# Patient Record
Sex: Male | Born: 1975 | Race: White | Hispanic: No | Marital: Married | Smoking: Former smoker
Health system: Southern US, Community
[De-identification: ages and names within clinical notes are randomized; demographics above are authoritative.]

## PROBLEM LIST (undated history)

## (undated) DIAGNOSIS — T7840XA Allergy, unspecified, initial encounter: Secondary | ICD-10-CM

## (undated) DIAGNOSIS — F419 Anxiety disorder, unspecified: Secondary | ICD-10-CM

## (undated) DIAGNOSIS — J45909 Unspecified asthma, uncomplicated: Secondary | ICD-10-CM

## (undated) DIAGNOSIS — U071 COVID-19: Secondary | ICD-10-CM

## (undated) DIAGNOSIS — R011 Cardiac murmur, unspecified: Secondary | ICD-10-CM

## (undated) DIAGNOSIS — K219 Gastro-esophageal reflux disease without esophagitis: Secondary | ICD-10-CM

## (undated) DIAGNOSIS — E785 Hyperlipidemia, unspecified: Secondary | ICD-10-CM

## (undated) HISTORY — DX: Unspecified asthma, uncomplicated: J45.909

## (undated) HISTORY — DX: Gastro-esophageal reflux disease without esophagitis: K21.9

## (undated) HISTORY — DX: Anxiety disorder, unspecified: F41.9

## (undated) HISTORY — DX: Hyperlipidemia, unspecified: E78.5

## (undated) HISTORY — DX: Cardiac murmur, unspecified: R01.1

## (undated) HISTORY — DX: Allergy, unspecified, initial encounter: T78.40XA

---
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
## 2002-04-04 HISTORY — PX: ANKLE SURGERY: SHX546

## 2005-09-08 ENCOUNTER — Emergency Department: Payer: Self-pay | Admitting: General Practice

## 2007-06-03 HISTORY — PX: ESOPHAGOGASTRODUODENOSCOPY: SHX1529

## 2007-09-20 ENCOUNTER — Emergency Department: Payer: Self-pay | Admitting: Emergency Medicine

## 2007-11-02 ENCOUNTER — Ambulatory Visit: Payer: Self-pay | Admitting: Internal Medicine

## 2007-11-02 DIAGNOSIS — K219 Gastro-esophageal reflux disease without esophagitis: Secondary | ICD-10-CM | POA: Insufficient documentation

## 2007-11-02 DIAGNOSIS — F419 Anxiety disorder, unspecified: Secondary | ICD-10-CM

## 2007-11-02 DIAGNOSIS — J309 Allergic rhinitis, unspecified: Secondary | ICD-10-CM | POA: Insufficient documentation

## 2007-11-05 LAB — CONVERTED CEMR LAB
Cholesterol: 178 mg/dL (ref 0–200)
LDL Cholesterol: 108 mg/dL — ABNORMAL HIGH (ref 0–99)
Triglycerides: 164 mg/dL — ABNORMAL HIGH (ref 0–149)
VLDL: 33 mg/dL (ref 0–40)

## 2008-05-30 ENCOUNTER — Ambulatory Visit: Payer: Self-pay | Admitting: Internal Medicine

## 2008-07-16 ENCOUNTER — Ambulatory Visit: Payer: Self-pay | Admitting: Family Medicine

## 2009-05-14 ENCOUNTER — Ambulatory Visit: Payer: Self-pay | Admitting: Family Medicine

## 2009-05-14 LAB — CONVERTED CEMR LAB
Blood in Urine, dipstick: NEGATIVE
Urobilinogen, UA: 0.2
WBC Urine, dipstick: NEGATIVE

## 2009-06-26 ENCOUNTER — Telehealth (INDEPENDENT_AMBULATORY_CARE_PROVIDER_SITE_OTHER): Payer: Self-pay | Admitting: *Deleted

## 2009-09-08 ENCOUNTER — Telehealth: Payer: Self-pay | Admitting: Internal Medicine

## 2009-10-09 ENCOUNTER — Ambulatory Visit: Payer: Self-pay | Admitting: Internal Medicine

## 2010-02-19 ENCOUNTER — Ambulatory Visit: Payer: Self-pay | Admitting: Internal Medicine

## 2010-03-22 ENCOUNTER — Ambulatory Visit: Payer: Self-pay | Admitting: Internal Medicine

## 2010-05-04 NOTE — Assessment & Plan Note (Signed)
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 * Cipro  Past History:  Past medical, surgical, family and social histories (including risk factors) reviewed for relevance to current acute and chronic problems.  Past Medical History: Reviewed history from 11/02/2007 and no changes required. GERD Anxiety Allergic rhinitis  Past Surgical History: Reviewed history from 11/02/2007 and no changes required. 3/09 EGD--hiatal hernia and GERD 1995 Left 2nd finger repair 2004 Left ankle surgery  Family History: Dad with DM Mom --good health but chronic depression 1 brother HTN  & CAD in pat grandparents Mat GF died of lung cancer Mat GM died of Lewy body dementia No colon or prostate cancer  Social History: Reviewed history from 05/14/2009 and no changes required. 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   Psych:  normally interactive, good eye contact, not anxious appearing, and not depressed appearing.     Impression & Recommendations:  Problem # 1:  ANXIETY (ICD-300.00) Assessment Comment Only did have spell last month when on the road---dong better now discussed non pharmacologic anxiety relieving tasks, like exercise will continue to use as needed vailum buspar again if more persistent symptoms  The following medications were removed from the medication list:    Diazepam 5 Mg Tabs (Diazepam) .Marland Kitchen... 1/2 - 1 three times a day as needed for nerves  Patient Instructions: 1)  Please schedule a follow-up appointment in 4-6 months for physical. Cancel the appt later this month  Current Allergies (reviewed today): ! * CIPRO

## 2010-05-04 NOTE — Progress Notes (Signed)
Summary: needs something for anxiety  Phone Note Call from Patient Call back at Work Phone [phone]   Caller: Patient Call For: Cindee Salt MD Summary of Call: Pt is out of town on business and he is having bad anxiety attacks for the last couple of days.  He says he used to take buspar and diazepam for this.  He is asking if he can have something called in until he can get back to town and come in for a visit.  Please send to [facility] [place], [place]-  [phone]. Initial call taken by: Lowella Petties CMA,  September 08, 2009 1:03 PM  Follow-up for Phone Call        okay to send Rx for diazepam 5mg  1/2 - 1 three times a day as needed for nerves #20 x 0  set up appt for when he is back in town Follow-up by: Cindee Salt MD,  September 08, 2009 1:58 PM  Additional Follow-up for Phone Call Additional follow up Details #1::        Rx Called In, Spoke with patient and advised results.  Additional Follow-up by: Mervin Hack CMA Duncan Dull),  September 08, 2009 4:11 PM    New/Updated Medications: DIAZEPAM 5 MG TABS (DIAZEPAM) 1/2 - 1 three times a day as needed for nerves Prescriptions: DIAZEPAM 5 MG TABS (DIAZEPAM) 1/2 - 1 three times a day as needed for nerves  #20 x 0   Entered by:   Mervin Hack CMA (AAMA)   Authorized by:   Cindee Salt MD   Signed by:   Mervin Hack CMA (AAMA) on 09/08/2009   Method used:   Telephoned to ...         RxID:   [id number]

## 2010-05-04 NOTE — Assessment & Plan Note (Signed)
Summary: SINUS INFECTION/DLO   Vital Signs:  Patient profile:   35 year old male Weight:      196.25 pounds Temp:     98.5 degrees F oral Pulse rate:   76 / minute Pulse rhythm:   regular BP sitting:   108 / 70  (right arm) Cuff size:   regular  Vitals Entered By: Selena Batten Dance CMA Duncan Dull) (February 19, 2010 11:21 AM) CC: ? Sinus infection   History of Present Illness: CC: ? sinus infection  1wk h/o congestion, spitting up green mucous.  started with fever whice has since resolved.  + drainage at night, cough worse at night.  taking allegra D, tylenol cold.  Now started mucinex which is not really helping get mucous out.  + ears muffled up.  + purulent discharge out of cough and nose.    No RN, HA, abd pain, n/v/d, rashes, myalgias or arthalgias.  No more fevers.  increased work recently.  ? sinus infection.  + son sick (15mo).    No smokers at home.  no recent asthma.    Current Medications (verified): 1)  None  Allergies: 1)  ! 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   Head:  Normocephalic and atraumatic without obvious abnormalities. No apparent alopecia or balding. NT sinuses Eyes:  PERRLA, EOMI, no injection Ears:  TMs clear Nose:  nares clear Mouth:  MMM, no pharyngeal erythema/edema Neck:  No deformities, masses, or tenderness noted.  no LAD Lungs:  Normal respiratory effort, chest expands symmetrically. Lungs are clear to auscultation, no crackles or wheezes. Heart:  Normal rate and regular rhythm. S1 and S2 normal without gallop, murmur, click, rub or other extra sounds. Pulses:  2+ rad pulses, brisk cap  refill Extremities:  no edema Skin:  no rashes and no suspicious lesions.     Impression & Recommendations:  Problem # 1:  VIRAL URI (ICD-465.9) could be early sinus infection.  supportive care for now.  1 wk hx.  advised to call and let us know if not better Monday. red flags to return dsicussed His updated medication list for this problem includes:    Cheratussin Ac 100-10 Mg/66ml Syrp (Guaifenesin-codeine) ..... One teaspoon at bedtime as needed cough  Complete Medication List: 1)  Flonase 50 Mcg/act Susp (Fluticasone propionate) .... 2 squirts in each nostril daily 2)  Cheratussin Ac 100-10 Mg/67ml Syrp (Guaifenesin-codeine) .... One teaspoon at bedtime as needed cough  Patient Instructions: 1)  Sounds like you have a viral upper respiratory infection. 2)  Antibiotics are not needed for this.  Viral infections usually take 7-10 days to resolve.  The cough can last up to 4-6 weeks to go away. 3)  Nasal saline spray or neti pot to  4)  Mucinex one a day or guaifenesin immediate release in am and at noon with plenty of fluid. 5)  Use medication as prescribed: flonase and cheratussin at night for cough. 6)  Please return if you are not improving as expected, or if you have high fevers (>101.5) or difficulty breathing. 7)  Call clinic with questions.  Pleasure to see you today. Prescriptions: CHERATUSSIN AC 100-10 MG/5ML SYRP (GUAIFENESIN-CODEINE)  one teaspoon at bedtime as needed cough  #100cc x 0   Entered and Authorized by:   Eustaquio Boyden  MD   Signed by:   Eustaquio Boyden  MD on 02/19/2010   Method used:   Print then Give to Patient   RxID:   [id number] FLONASE 50 MCG/ACT SUSP (FLUTICASONE PROPIONATE) 2 squirts in each nostril daily  #1 x 3   Entered and Authorized by:   Eustaquio Boyden  MD   Signed by:   Eustaquio Boyden  MD on 02/19/2010   Method used:   [facility] [address] [id number]       [address]       [place], [place]   [address]       Ph: [phone] or [phone]       Fax: [phone]   RxID:   [id number]    Orders Added: 1)  Est. Patient Level III [875[address]]    Prior Medications: Current Allergies (reviewed today): ! * CIPRO

## 2010-05-04 NOTE — Progress Notes (Signed)
Summary: Saw Urologist in [place]  Phone Note Outgoing Call   Call placed by: Daine Gip,  June 26, 2009 11:29 AM Call placed to: Patient Details for Reason: Pt went to Urology Physician in [place], [place] Summary of Call: Called pt regarding the Urology referral scheduled in Feb, 2011.  Pt says he saw a Urology physician in [place]- Dr. Carylon Perches (pt was not sure of the spelling).  Koray will call this physican and have the notes sent over to our office.Daine Gip  June 26, 2009 11:32 AM Initial call taken by: Daine Gip,  June 26, 2009 11:32 AM  Follow-up for Phone Call        okay Cindee Salt MD  June 26, 2009 5:36 PM

## 2010-05-04 NOTE — Assessment & Plan Note (Signed)
Summary: pain when urinate/ alc   Vital Signs:  Patient profile:   35 year old male Height:      71 inches Weight:      199.13 pounds BMI:     27.87 Temp:     98.4 degrees F oral Pulse rate:   88 / minute Pulse rhythm:   regular BP sitting:   118 / 92  (left arm) Cuff size:   regular  Vitals Entered By: Delilah Shan CMA Duncan Dull) (May 14, 2009 9:47 AM) CC: Painful urination   History of Present Illness: 35 yo with 4 days of dysuria, perienal and low back pain. No fevers or chills. Has had increased frequency and feeling of not completely emptying his bladder. Noticed some constipation as well a couple days ago. Sexually active with wife only.  She had baby 7 weeks ago so they have only had sex once since baby was born.  These symptoms started shortly after sexual intercourse. Also started working out in gym, using stationary bike.  He is unsure if that is related to this discomfort. No n/v/d.   Current Medications (verified): 1)  Multivitamins   Tabs (Multiple Vitamin) .... Take 1 Tablet By Mouth Once A Day 2)  Vitamin D 1000 Unit  Tabs (Cholecalciferol) .... Take 1 Tablet By Mouth Once A Day 3)  Fish Oil   Oil (Fish Oil) .... Take 1 Tablet By Mouth Once A Day 4)  Bactrim Ds 800-160 Mg Tabs (Sulfamethoxazole-Trimethoprim) .Marland Kitchen.. 1 Tab By Mouth Two Times A Day X 10 Days  Allergies: 1)  ! 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 GI:  Complains of constipation; denies nausea and vomiting. GU:  Complains of dysuria, urinary frequency, and urinary hesitancy; denies discharge, erectile dysfunction, hematuria, and incontinence.  Physical Exam  General:  alert, well-developed, well-nourished, and well-hydrated.   VS reviewed- afebrile and normotensive Abdomen:  soft,  non-tender, and no masses.   Genitalia:  no scrotal masses and no testicular masses or atrophy.   Prostate:  boggy, non tender.   Skin:  no rashes and no suspicious lesions.   Psych:  normally interactive, good eye contact, not anxious appearing, and not depressed appearing.     Impression & Recommendations:  Problem # 1:  DYSURIA (ICD-788.1) Assessment New UA neg for blood, LE or nitrites.  Most like prostatits but will send urine for culture and refer to urology for further work up given age and level of anxiety concerning these symptoms. Treat with Bactrim (allergy for cipro). See patient instructions for further details. The following medications were removed from the medication list:    Cipro 500 Mg Tabs (Ciprofloxacin hcl) .Marland Kitchen... 1 tab by mouth two times a day x 14 days His updated medication list for this problem includes:    Bactrim Ds 800-160 Mg Tabs (Sulfamethoxazole-trimethoprim) .Marland Kitchen... 1 tab by mouth two times a day x 10 days  Orders: UA Dipstick w/o Micro (manual) (74259) T-Culture, Urine ([id number]) Urology Referral (Urology)  Complete Medication List: 1)  Multivitamins Tabs (Multiple vitamin) .... Take 1 tablet by mouth once a day 2)  Vitamin D 1000 Unit Tabs (Cholecalciferol) .... Take 1 tablet by mouth once a day 3)  Fish Oil Oil (Fish oil) .... Take 1 tablet by mouth once a day 4)  Bactrim Ds 800-160 Mg Tabs (Sulfamethoxazole-trimethoprim) .Marland Kitchen.. 1 tab by mouth two times a day x 10 days  Patient Instructions: 1)  It was so nice to meet you, Shaun Phillips. 2)  Take Bactrim as directed. 3)  Use stool softeners to help with constipation and Ibuprofen up to 800 mg three times a day for discomfort. 4)  Please stop by to see Shirlee Limerick on your way out to set up your urology referral. Prescriptions: BACTRIM DS 800-160 MG TABS (SULFAMETHOXAZOLE-TRIMETHOPRIM) 1 tab by mouth two times a day x 10 days  #20 x 0   Entered and Authorized by:   Ruthe Mannan MD   Signed by:   Ruthe Mannan MD on  05/14/2009   Method used:   [facility]        [facility] [address] [id number]       [address]       [place], [place]  [address]       Ph: [phone] or [phone]       Fax: [phone]   RxID:   [id number] CIPRO 500 MG TABS (CIPROFLOXACIN HCL) 1 tab by mouth two times a day x 14 days  #28 x 0   Entered and Authorized by:   Ruthe Mannan MD   Signed by:   Ruthe Mannan MD on 05/14/2009   Method used:   [facility]        [facility] [id number]       [address]       [place], [place]  [address]       Ph: [phone] or [phone]       Fax: [phone]   RxID:   [id number]   Current Allergies (reviewed today): ! * CIPRO  Laboratory Results   Urine Tests   Date/Time Reported: May 14, 2009 10:05 AM   Routine Urinalysis   Color: yellow Appearance: Clear Glucose: negative   (Normal Range: Negative) Bilirubin: negative   (Normal Range: Negative) Ketone: negative   (Normal Range: Negative) Spec. Gravity: 1.020   (Normal Range: 1.003-1.035) Blood: negative   (Normal Range: Negative) pH: 5.0   (Normal Range: 5.0-8.0) Protein: trace   (Normal Range: Negative) Urobilinogen: 0.2   (Normal Range: 0-1) Nitrite: negative   (Normal Range: Negative) Leukocyte Esterace: negative   (Normal Range: Negative)        Appended Document: pain when urinate/ alc

## 2010-05-06 NOTE — Assessment & Plan Note (Signed)
Summary: CPX.Shaun KitchenCYD   Vital Signs:  Patient profile:   35 year old male Height:      71 inches Weight:      197 pounds Temp:     98.4 degrees F oral Pulse rate:   80 / minute Pulse rhythm:   regular BP sitting:   120 / 90  (left arm) Cuff size:   large  Vitals Entered By: Mervin Hack CMA Duncan Dull) (March 22, 2010 11:38 AM) CC: adult physical   History of Present Illness: Still travels for work all the time Trying to get a job with less travel  Has only needed the valium once or twice since last visit for anxiety some months ago No ongoing problems  Trying to exercise more  Still has some yellow mucus in chest some cough flonase is helping the nose though No fever  Allergies: 1)  ! * Cipro  Past History:  Past medical, surgical, family and social histories (including risk factors) reviewed for relevance to current acute and chronic problems.  Past Medical History: Reviewed history from 11/02/2007 and no changes required. GERD Anxiety Allergic rhinitis  Past Surgical History: Reviewed history from 11/02/2007 and no changes required. 3/09 EGD--hiatal hernia and GERD 1995 Left 2nd finger repair 2004 Left ankle surgery  Family History: Reviewed history from 10/09/2009 and no changes required. Dad with DM Mom --good health but chronic depression 1 brother HTN  & CAD in pat grandparents Mat GF died of lung cancer Mat GM died of Lewy body dementia No colon or prostate cancer  Social History: Reviewed history from 05/14/2009 and no changes required. 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 Eyes:  Denies double vision and vision loss-1 eye. ENT:  Denies decreased hearing and ringing in ears; teeth fine--regular with dentist. CV:  Denies chest pain or discomfort,  difficulty breathing at night, difficulty breathing while lying down, fainting, lightheadness, palpitations, and shortness of breath with exertion. Resp:  Complains of cough; denies shortness of breath; cough with current illness. GI:  Complains of indigestion; denies abdominal pain, bloody stools, change in bowel habits, and dark tarry stools; gets intermittent reflux--zegerid in the past. GU:  Denies erectile dysfunction, incontinence, urinary frequency, and urinary hesitancy; did have one spell of prostatitis. MS:  Denies joint pain and joint swelling. Derm:  Denies lesion(s) and rash; sees derm regularly for mole check---mom had melanoma. Neuro:  Denies headaches, numbness, tingling, and weakness. Psych:  Denies anxiety and depression. Heme:  Denies abnormal bruising and enlarge lymph nodes. Allergy:  Complains of seasonal allergies and sneezing; just spring mostly uses allegra.  Physical Exam  General:  alert and normal appearance.   Eyes:  pupils equal, pupils round, pupils reactive to light, and no optic disk abnormalities.   Ears:  R ear normal and L ear normal.   Mouth:  no erythema, no exudates, and no lesions.   Neck:  supple, no masses, no thyromegaly, no carotid bruits, and no cervical lymphadenopathy.   Lungs:  normal respiratory effort, no intercostal retractions, no accessory muscle use, and normal breath sounds.   Heart:  normal rate, regular rhythm, no murmur, and no gallop.   Abdomen:  soft, non-tender, and no masses.   Msk:  no joint tenderness and no joint swelling.   Pulses:  normal in feet Extremities:  no edema Neurologic:  alert & oriented X3, strength normal in all extremities, and gait normal.   Skin:  no rashes and no suspicious lesions.   Multiple benign nevi Axillary Nodes:  No palpable lymphadenopathy Psych:  normally interactive, good eye contact, not anxious appearing, and not depressed appearing.     Impression & Recommendations:  Problem # 1:   PREVENTIVE HEALTH CARE (ICD-V70.0) Assessment Comment Only healthy discussed increased exercise and sleep hygiene  Problem # 2:  BRONCHITIS- ACUTE (ICD-466.0) Assessment: Deteriorated persistent resp infection with some purulent sputum will try amoxi  Problem # 3:  ANXIETY (ICD-300.00) Assessment: Improved rarely needs diazepam no other Rx indicated  Problem # 4:  GERD (ICD-530.81) Assessment: Deteriorated some symptoms will have him try as needed PPI His updated medication list for this problem includes:    Omeprazole 20 Mg Tbec (Omeprazole) .Shaun Phillips... 1 tab daily as needed for acid reflux  Complete Medication List: 1)  Flonase 50 Mcg/act Susp (Fluticasone propionate) .... 2 squirts in each nostril daily 2)  Omeprazole 20 Mg Tbec (Omeprazole) .Shaun Phillips.. 1 tab daily as needed for acid reflux 3)  Amoxicillin 500 Mg Tabs (Amoxicillin) .... 2 tabs by mouth two times a day for bronchitis  Patient Instructions: 1)  Please use the omeprazole as needed for acid problems 2)  Take the antibiotic and call next week if the productive cough hasn't improved 3)  Please schedule a follow-up appointment in 1-2 years Prescriptions: AMOXICILLIN 500 MG TABS (AMOXICILLIN) 2 tabs by mouth two times a day for bronchitis  #40 x 0   Entered and Authorized by:   Cindee Salt MD   Signed by:   Cindee Salt MD on 03/22/2010   Method used:   [facility]        [facility] [address] [facility]       [address]       [place], [place]  [address]       Ph: [phone] or [phone]       Fax: [phone]   RxID:   [id number]    Orders Added: 1)  Est. Patient 18-39 years [[address]]    Current Allergies (reviewed today): ! * CIPRO

## 2011-09-07 ENCOUNTER — Telehealth: Payer: Self-pay | Admitting: Internal Medicine

## 2011-09-07 NOTE — Telephone Encounter (Signed)
Caller: Jamey/Patient is calling with a question about Diazepam.The medication was written by Katherina Right is out of town working in [place].  States that he has seen MD in past d/t panic attacks.  Has taken Diazepam in the past.  Requesting Diazepam to be called in to [facility] [phone].  Has been having panic attacks while being out of town.  Today, was shopping in a store and started not feeling well.  Felt SOB at that time but symptoms have started to decrease.  Continuing to feel "not clear/dizzy".  Utilized Anxiety: Panic Guideline.  Advised ED.  Pt is undecided if he will go to ED.  Requesting medication to be called in.  Please f/u with Kellie Shropshire.

## 2011-09-08 NOTE — Telephone Encounter (Signed)
Spoke with patient he went to the ER and they gave him lorazepam so he doesn't need the diazepam, pt states he should be home in the next week or so, then he will set up CPX.

## 2011-09-08 NOTE — Telephone Encounter (Signed)
I know about his anxiety and diazepam use in the past Okay to send order for diazepam 5mg   1 bid prn for anxiety or panic #10 x 0 Needs to set up physical since he hasn't been seen in a couple of years

## 2012-04-25 ENCOUNTER — Ambulatory Visit (INDEPENDENT_AMBULATORY_CARE_PROVIDER_SITE_OTHER): Payer: 59 | Admitting: Internal Medicine

## 2012-04-25 ENCOUNTER — Encounter: Payer: Self-pay | Admitting: Internal Medicine

## 2012-04-25 VITALS — BP 128/88 | HR 75 | Temp 98.0°F | Ht 70.5 in | Wt 199.0 lb

## 2012-04-25 DIAGNOSIS — F411 Generalized anxiety disorder: Secondary | ICD-10-CM

## 2012-04-25 DIAGNOSIS — R0789 Other chest pain: Secondary | ICD-10-CM

## 2012-04-25 DIAGNOSIS — Z0000 Encounter for general adult medical examination without abnormal findings: Secondary | ICD-10-CM

## 2012-04-25 DIAGNOSIS — K219 Gastro-esophageal reflux disease without esophagitis: Secondary | ICD-10-CM

## 2012-04-25 LAB — BASIC METABOLIC PANEL
BUN: 17 mg/dL (ref 6–23)
CO2: 30 mEq/L (ref 19–32)
Calcium: 9.9 mg/dL (ref 8.4–10.5)
Glucose, Bld: 92 mg/dL (ref 70–99)
Potassium: 4 mEq/L (ref 3.5–5.1)
Sodium: 138 mEq/L (ref 135–145)

## 2012-04-25 LAB — CBC WITH DIFFERENTIAL/PLATELET
Basophils Absolute: 0 10*3/uL (ref 0.0–0.1)
Eosinophils Absolute: 0.2 10*3/uL (ref 0.0–0.7)
HCT: 45.7 % (ref 39.0–52.0)
Hemoglobin: 15.5 g/dL (ref 13.0–17.0)
Lymphs Abs: 2.3 10*3/uL (ref 0.7–4.0)
MCHC: 33.9 g/dL (ref 30.0–36.0)
Neutro Abs: 4 10*3/uL (ref 1.4–7.7)
RDW: 13.1 % (ref 11.5–14.6)

## 2012-04-25 LAB — HEPATIC FUNCTION PANEL
Albumin: 5 g/dL (ref 3.5–5.2)
Total Protein: 8.6 g/dL — ABNORMAL HIGH (ref 6.0–8.3)

## 2012-04-25 LAB — LIPID PANEL
Cholesterol: 203 mg/dL — ABNORMAL HIGH (ref 0–200)
HDL: 38.5 mg/dL — ABNORMAL LOW (ref >39.00)
VLDL: 36.8 mg/dL (ref 0.0–40.0)

## 2012-04-25 LAB — LDL CHOLESTEROL, DIRECT: Direct LDL: 117.4 mg/dL

## 2012-04-25 NOTE — Assessment & Plan Note (Signed)
Controlled Just uses OTC ranitidine prn

## 2012-04-25 NOTE — Assessment & Plan Note (Signed)
Healthy Discussed fitness as he has let this slack

## 2012-04-25 NOTE — Assessment & Plan Note (Signed)
Has been an issue due to anxiety---makes him nervous and may actually bring on the pain EKG is reassuring Discussed keeping up with fitness efforts

## 2012-04-25 NOTE — Progress Notes (Signed)
Subjective:    Patient ID: Shaun Phillips, male    DOB: Feb 16, 1976, 37 y.o.   MRN: [id number]  HPI Here for physical Still with intermittent anxiety---uses the diazepam rarely Still travels for work and can be away for extended times Staying home lately and feels better  Had been exercising but has slacked off Weight stable Does have concern due to dad's DM  Occ gets "small chest pains"---- variable places, both shoulders More often at rest Usually resolves on its own ?anxiety related-----not when he is exercising Can get HR into 160's without symptoms  No current outpatient prescriptions on file prior to visit.    Allergies  Allergen Reactions  . Ciprofloxacin     REACTION: ANAPHYLACTIC LIKE REACTION    Past Medical History  Diagnosis Date  . GERD (gastroesophageal reflux disease)   . Anxiety   . Allergy     Past Surgical History  Procedure Date  . Esophagogastroduodenoscopy 03/09    HIATAL HERNIA AND GERD  . Ankle surgery 2004    left  . Finger surgery 1995    left 2nd finger repair    Family History  Problem Relation Age of Onset  . Depression Mother   . Diabetes Father   . Hypertension Paternal Grandmother   . CAD Paternal Grandmother   . Hypertension Paternal Grandfather   . CAD Paternal Grandfather   . Cancer Neg Hx     prostate or colon    History   Social History  . Marital Status: Married    Spouse Name: N/A    Number of Children: 1  . Years of Education: N/A   Occupational History  . Curator for [employer]    Social History Main Topics  . Smoking status: Former Smoker    Quit date: 04/05/2011  . Smokeless tobacco: Never Used  . Alcohol Use: Yes     Comment: intermittent beers -- esp if out with friends  . Drug Use: Not on file  . Sexually Active: Not on file   Other Topics Concern  . Not on file   Social History Narrative   1 son and another due 06/21/12   Review of Systems  Constitutional: Negative for fatigue and unexpected  weight change.       Wears seat belt  HENT: Negative for hearing loss, dental problem and tinnitus.        Wears hearing protection but exposed to a lot of noise Regular with dentist Mild allergy problems---claritin or allegra help  Eyes: Negative for visual disturbance.       No diplopia or unilateral vision loss  Respiratory: Negative for cough, chest tightness and shortness of breath.   Cardiovascular: Positive for chest pain. Negative for palpitations and leg swelling.  Gastrointestinal: Negative for nausea, vomiting, abdominal pain, constipation and blood in stool.       No sig heartburn of late---uses zantac once in a while  Genitourinary: Negative for urgency, frequency and difficulty urinating.       No sexual problems  Musculoskeletal: Negative for back pain, joint swelling and arthralgias.  Skin: Negative for rash.       No suspicious lesions---does go for routine derm evals  Neurological: Negative for dizziness, syncope, weakness, light-headedness, numbness and headaches.  Hematological: Negative for adenopathy. Does not bruise/bleed easily.  Psychiatric/Behavioral: Negative for sleep disturbance and dysphoric mood. The patient is nervous/anxious.        Restless sleeper but gets up by 5AM and does okay in day  Objective:   Physical Exam  Constitutional: He is oriented to person, place, and time. He appears well-developed and well-nourished. No distress.  HENT:  Head: Normocephalic and atraumatic.  Right Ear: External ear normal.  Left Ear: External ear normal.  Mouth/Throat: Oropharynx is clear and moist. No oropharyngeal exudate.  Eyes: Conjunctivae normal and EOM are normal. Pupils are equal, round, and reactive to light.  Neck: Normal range of motion. Neck supple. No thyromegaly present.  Cardiovascular: Normal rate, regular rhythm, normal heart sounds and intact distal pulses.  Exam reveals no gallop.   No murmur heard. Pulmonary/Chest: Effort normal and  breath sounds normal. No respiratory distress. He has no wheezes. He has no rales. He exhibits no tenderness.  Abdominal: Soft. There is no tenderness.  Musculoskeletal: He exhibits no edema and no tenderness.  Lymphadenopathy:    He has no cervical adenopathy.  Neurological: He is alert and oriented to person, place, and time.  Skin: No rash noted.       Multiple benign nevi  Psychiatric: He has a normal mood and affect. His behavior is normal.          Assessment & Plan:

## 2012-04-25 NOTE — Assessment & Plan Note (Signed)
Only intermittent Rarely uses the diazepam

## 2012-10-02 ENCOUNTER — Ambulatory Visit (INDEPENDENT_AMBULATORY_CARE_PROVIDER_SITE_OTHER): Payer: 59 | Admitting: Family Medicine

## 2012-10-02 ENCOUNTER — Encounter: Payer: Self-pay | Admitting: Family Medicine

## 2012-10-02 VITALS — BP 116/82 | HR 80 | Temp 97.8°F | Wt 202.0 lb

## 2012-10-02 DIAGNOSIS — F411 Generalized anxiety disorder: Secondary | ICD-10-CM

## 2012-10-02 DIAGNOSIS — R0789 Other chest pain: Secondary | ICD-10-CM

## 2012-10-02 DIAGNOSIS — R079 Chest pain, unspecified: Secondary | ICD-10-CM

## 2012-10-02 MED ORDER — CITALOPRAM HYDROBROMIDE 20 MG PO TABS: 20.0000 mg | ORAL_TABLET | Freq: Every day | ORAL | Status: DC

## 2012-10-02 MED ORDER — LORAZEPAM 1 MG PO TABS: 0.5000 mg | ORAL_TABLET | Freq: Two times a day (BID) | ORAL | Status: DC | PRN

## 2012-10-02 NOTE — Progress Notes (Signed)
  Subjective:    Patient ID: Shaun Phillips, male    DOB: 28-Jun-1975, 37 y.o.   MRN: [id number]  HPI Patient presents with complaints of worsening chest pain the past couple of days.  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  Experiences lots of anxiety with his projects, especially towards their completion.  Works about 16 hours a day for 8 weeks at a time.  Experiences pain in the upper chest that alternates between right and left side.  Says that it feels "direct" and "uncomfortable".  Has some palpitations when the pain starts, sweating, light-headedness, and increased panic.  Feels like he is unable to control when the pain occurs, happens at rest and when doing other activities.  States that he has trouble relaxing and winding down.  Difficulty sleeping most nights, usually wakes up about 6 or 7 times.  Sometimes he wakes up from his sleep with chest pain.  After lunch he sometimes falls asleep, very unusual for him.  Has experienced some shortness of breath, but also says that it is dusty and dirty at works and doesn't wear a respirator even though he possibly should.  In the past he has taken diazepam, lexapro, and buspar.  None have really helped.  Has not noticed any reflux symptoms lately.  No new changes to diet.  Usually has one cup of coffee in the morning, lately he has been drinking one in the afternoon also.  Does not consume energy drinks.  Was a social smoker in the past, but quit several years ago.     Review of Systems  Constitutional: Positive for fatigue. Negative for fever, chills, activity change and appetite change.  Respiratory: Positive for shortness of breath. Negative for apnea and chest tightness.   Cardiovascular: Positive for chest pain and palpitations.  Neurological: Positive for light-headedness. Negative for syncope, weakness, numbness and headaches.  Psychiatric/Behavioral: Positive for sleep disturbance. The patient is nervous/anxious.         Objective:   Physical Exam  Constitutional: He is oriented to person, place, and time. He appears well-developed and well-nourished. No distress.  Eyes: Pupils are equal, round, and reactive to light.  Cardiovascular: Normal rate, regular rhythm and normal heart sounds.  Exam reveals no gallop and no friction rub.   No murmur heard. Pulmonary/Chest: Effort normal and breath sounds normal. No respiratory distress. He has no wheezes. He exhibits no tenderness.  Neurological: He is alert and oriented to person, place, and time.  Psychiatric: He has a normal mood and affect.          Assessment & Plan:  Panic attacks-likely due to palpitations, sweating, chest discomfort, and stress level -given celexa 20 mg to take daily for the next month -given lorazepam to take as needed -instructed to stop diazepam -identify ways to cope with and relieve stress -f/u in a month to evaluate progress

## 2012-10-02 NOTE — Progress Notes (Signed)
Pt seen and examined with PA student Anna Genre  CC: chest pain  Pleasant 37 yo with h/o anxiety as well as GERD presents with several day history of worsening chest pain described as uncomfortable sensation at R shoulder and L chest, not pressure/tightness or sharp/stabbing.  This is associated with increased panicky feel, palpitations, and sweating.  No known triggers.  Occasional dyspnea.  Chest pain non exertional, not relieved with rest.  Trouble relaxing even when at home.  Feels "at point of nervous breakdown".  Trouble sleeping at night as well 2/2 stress.  [facility] [place] [facility], [place] on [place], crossfit 2 years ago.  No chest pain with this.  Has seen PCP in past, with nonspecific chest pain thought to be anxiety related.  Anxiety has been treated with diazepam 5mg  prn in past.  Has also been treated with lexapro and buspar - neither really helped.  Has seen ER in past 2/2 chest pains, treated for anxiety attacks.  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  Works up to 16 hr days.  Started new job 07/26/2012 - working in [place] - significant stress since started new job, getting worse.  Has 1 more month of this project left.  Some increased caffeine recently.  No energy drinks. No recent smoking - quit several years ago. No reproducible chest pain. No heartburn sxs.  No h/o HTN, HLD, DM.  Paternal grandmother - CAD and CABG at age 29s.  Past Medical History  Diagnosis Date  . GERD (gastroesophageal reflux disease)   . Anxiety   . Allergy      PE: GEN: WDWN CM, NAD CVS: nl S1, S2, no m/r/g Pulm: CTAB, no crackles/wheezing Psych: pleasant, normal affect, cooperative with exam.  EKG - NSR rate 70s, normal axis, intervals, hypertrophy, no acute ST/T changes

## 2012-10-02 NOTE — Assessment & Plan Note (Addendum)
Noncardiac in nature - see below. Treat with lorazepam PRN. EKG today benign - NSR rate 70s, normal axis, intervals, no hypertropy or acute ST/T changes.  rsR' V1 but no other criterial for RBBB.

## 2012-10-02 NOTE — Patient Instructions (Signed)
I think this is stress related. Start celexa 20mg  daily for the next month. Stop diazepam, start lorazepam as needed. Work on Tax inspector. Return to see [place] in 1 month for follow up.

## 2012-10-02 NOTE — Assessment & Plan Note (Addendum)
Anticipate anxiety attacks related to increased work stress. Discussed importance of stress relieving strategies. Will recommend he stay out of work for the next week. Treat anxiety attacks with daily celexa - discussed need to take this med for 1 mo to gauge true effect. Discussed use of lorazepam instead of diazepam for anxiety attacks/sleep prn. RTC 1 mo for f/u.

## 2012-11-01 ENCOUNTER — Encounter: Payer: Self-pay | Admitting: Radiology

## 2012-11-02 ENCOUNTER — Ambulatory Visit: Payer: 59 | Admitting: Family Medicine

## 2012-11-02 DIAGNOSIS — Z0289 Encounter for other administrative examinations: Secondary | ICD-10-CM

## 2013-02-07 ENCOUNTER — Other Ambulatory Visit: Payer: Self-pay

## 2013-10-16 ENCOUNTER — Ambulatory Visit (INDEPENDENT_AMBULATORY_CARE_PROVIDER_SITE_OTHER): Payer: 59 | Admitting: Family Medicine

## 2013-10-16 ENCOUNTER — Encounter: Payer: Self-pay | Admitting: Family Medicine

## 2013-10-16 VITALS — BP 130/82 | HR 76 | Temp 98.1°F | Wt 205.5 lb

## 2013-10-16 DIAGNOSIS — F411 Generalized anxiety disorder: Secondary | ICD-10-CM

## 2013-10-16 DIAGNOSIS — A059 Bacterial foodborne intoxication, unspecified: Secondary | ICD-10-CM

## 2013-10-16 NOTE — Assessment & Plan Note (Signed)
Anticipate inflammatory diarrhea from food poisoning - salmonella or campylobacter, although incubation period of 6-8 hours more consistent with toxin producing organisms. Regardless, seems to be quickly resolving as evidence by decreased diarrhea today, no blood today and decreased abd discomfort today. No fever. Anticipate will be self limiting process, discussed home care with patient. Red flags to return discussed - if fever, or bloody diarrhea returns, or worsening abd pain or diarrhea, will check stool cultures and consider empiric abx therapy. Pt agrees with plan.

## 2013-10-16 NOTE — Patient Instructions (Addendum)
You had food poisoning. Should improve with time. In the meantime, clear liquids for next 24 hours then slowly advance diet.  let me know if it doesn't improve as expected , or any fever or worsening abdominal pain or return of bloody diarrhea. Schedule physical at your convenience, prior fasting for blood work  Food Poisoning Food poisoning is an illness caused by something you ate or drank. There are over 250 known causes of food poisoning. However, many other causes are unknown.You can be treated even if the exact cause of your food poisoning is not known. In most cases, food poisoning is mild and lasts 1 to 2 days. However, some cases can be serious, especially for people with low immune systems, the elderly, children and infants, and pregnant women. CAUSES  Poor personal hygiene, improper cleaning of storage and preparation areas, and unclean utensils can cause infection or tainting (contamination) of foods. The causes of food poisoning are numerous.Infectious agents, such as viruses, bacteria, or parasites, can cause harm by infecting the intestine and disrupting the absorption of nutrients and water. This can cause diarrhea and lead to dehydration. Viruses are responsible for most of the food poisonings in which an agent is found. Parasites are less likely to cause food poisoning. Toxic agents, such as poisonous mushrooms, marine algae, and pesticides can also cause food poisoning.  Viral causes of food poisoning include:  Norovirus.  Rotavirus.  Hepatitis A.  Bacterial causes of food poisoning include:  Salmonellae.  Campylobacter.  Bacillus cereus.  Escherichia coli (E. coli).  Shigella.  Listeria monocytogenes.  Clostridium botulinum (botulism).  Vibrio cholerae.  Parasites that can cause food poisoning include:  Giardia.  Cryptosporidium.  Toxoplasma. SYMPTOMS Symptoms may appear several hours or longer after consuming the contaminated food or drink. Symptoms  may include:  Nausea.  Vomiting.  Cramping.  Diarrhea.  Fever and chills.  Muscle aches. DIAGNOSIS Your caregiver may be able to diagnose food poisoning from a list of what you have recently eaten and results from lab tests. Diagnostic tests may include an exam of the feces. TREATMENT In most cases, treatment focuses on helping to relieve your symptoms and staying well hydrated. Antibiotics are rarely needed. In severe cases, hospitalization may be required. PREVENTION   Wash your hands, food preparation surfaces, and utensils thoroughly before and after handling raw foods.  Keep refrigerated foods below 40 F (5 C).  Serve hot foods immediately or keep them heated above 140 F (60 C).  Divide large volumes of food into small portions for rapid cooling in the refrigerator. Hot, bulky foods in the refrigerator can raise the temperature of other foods that have already cooled.  Follow approved canning procedures.  Heat canned foods thoroughly before tasting.  When in doubt, throw it out.  Infants, the elderly, women who are pregnant, and people with compromised immune systems are especially susceptible to food poisoning. These people should never consume unpasteurized cheese, unpasteurized cider, raw fish, raw seafood, or raw meat type products. HOME CARE INSTRUCTIONS   Drink enough water and fluids to keep your urine clear or pale yellow. Drink small amounts of fluids frequently and increase as tolerated.  Ask your caregiver for specific rehydration instructions.  Avoid:  Foods high in sugar.  Alcohol.  Carbonated drinks.  Tobacco.  Juice.  Caffeine drinks.  Extremely hot or cold fluids.  Fatty, greasy foods.  Too much intake of anything at one time.  Dairy products until 24 to 48 hours after diarrhea stops.  You may consume probiotics. Probiotics are active cultures of beneficial bacteria. They may lessen the amount and number of diarrheal stools in  adults. Probiotics can be found in yogurt with active cultures and in supplements.  Wash your hands well to avoid spreading the bacteria.  Only take over-the-counter or prescription medicines for pain, discomfort, or fever as directed by your caregiver. Do not give aspirin to children.  Ask your caregiver if you should continue to take your regular prescribed and over-the-counter medicines. SEEK IMMEDIATE MEDICAL CARE IF:   You have difficulty breathing, swallowing, talking, or moving.  You develop blurred vision.  You are unable to keep fluids down.  You faint or nearly faint.  Your eyes turn yellow.  Vomiting or diarrhea develops or becomes persistent.  Abdominal pain develops, increases, or localizes in one small area.  You have a fever.  The diarrhea becomes excessive or contains blood or mucus.  You develop excessive weakness, dizziness, or extreme thirst.  You have no urine for 8 hours. MAKE SURE YOU:   Understand these instructions.  Will watch your condition.  Will get help right away if you are not doing well or get worse. Document Released: 12/18/2003 Document Revised: 06/13/2011 Document Reviewed: 08/05/2010 [facility] Patient Information 2015 [employer] [place]. This information is not intended to replace advice given to you by your health care provider. Make sure you discuss any questions you have with your health care provider.

## 2013-10-16 NOTE — Assessment & Plan Note (Signed)
Off celexa. Stable on prn lorazepam. #30 prescribed last July has lasted him this year, declines refill (states still has 1/2 prescription at home) Discussed benadryl use for sleep at night rather than benzo.

## 2013-10-16 NOTE — Progress Notes (Signed)
   BP 130/82  Pulse 76  Temp(Src) 98.1 F (36.7 C) (Oral)  Wt 205 lb 8 oz (93.214 kg)   CC: bloody diarrhea  Subjective:    Patient ID: Shaun Phillips, male    DOB: Jan 21, 1976, 38 y.o.   MRN: [id number]  HPI: Shaun Phillips is a 38 y.o. male presenting on 10/16/2013 for Hematochezia   Recent business trip to [place]. Monday evening ate some salmon at dinner - Tuesday morning at 4am started having severe diarrhea x12 times, abd cramping. After diarrhea started noticing BRBPR with mucous. Blood in stool until 3-4 pm. Feeling nauseated but no vomiting. No fever. Today stool looser than normal but didn't see any more blood. Sore abd but no pain or cramping.  Appetite ok. Colleagues also had similar illness.  Recently changed jobs - hasn't needed meds. Over last month started noticing increasing anxiety attacks - treated with lorazepam. Still has #30 from 10/2012. Mainly taking prn at night time. Working out and staying active. Persistent trouble sleeping at night time.  Melatonin didn't help. Hasn't tried benadryl.  Requests to switch PCP to myself - I will ask front office to check with Dr. Alphonsus SiasLetvak and if ok will switch.  Relevant past medical, surgical, family and social history reviewed and updated as indicated.  Allergies and medications reviewed and updated. Current Outpatient Prescriptions on File Prior to Visit  Medication Sig  . LORazepam (ATIVAN) 1 MG tablet Take 0.5-1 tablets (0.5-1 mg total) by mouth 2 (two) times daily as needed for anxiety.   No current facility-administered medications on file prior to visit.    Review of Systems Per HPI unless specifically indicated above    Objective:    BP 130/82  Pulse 76  Temp(Src) 98.1 F (36.7 C) (Oral)  Wt 205 lb 8 oz (93.214 kg)  Physical Exam  Nursing note and vitals reviewed. Constitutional: He appears well-developed and well-nourished. No distress.  HENT:  Mouth/Throat: Oropharynx is clear and moist. No oropharyngeal exudate.   Cardiovascular: Normal rate, regular rhythm, normal heart sounds and intact distal pulses.   No murmur heard. Pulmonary/Chest: Effort normal and breath sounds normal. No respiratory distress. He has no wheezes. He has no rales.  Abdominal: Soft. Bowel sounds are normal. He exhibits no distension and no mass. There is no tenderness. There is no rebound and no guarding.  Musculoskeletal: He exhibits no edema.  Skin: Skin is warm and dry. No rash noted.       Assessment & Plan:   Problem List Items Addressed This Visit   Food poisoning - Primary     Anticipate inflammatory diarrhea from food poisoning - salmonella or campylobacter, although incubation period of 6-8 hours more consistent with toxin producing organisms. Regardless, seems to be quickly resolving as evidence by decreased diarrhea today, no blood today and decreased abd discomfort today. No fever. Anticipate will be self limiting process, discussed home care with patient. Red flags to return discussed - if fever, or bloody diarrhea returns, or worsening abd pain or diarrhea, will check stool cultures and consider empiric abx therapy. Pt agrees with plan.    ANXIETY     Off celexa. Stable on prn lorazepam. #30 prescribed last July has lasted him this year, declines refill (states still has 1/2 prescription at home) Discussed benadryl use for sleep at night rather than benzo.        Follow up plan: Return if symptoms worsen or fail to improve, for as needed for physical.

## 2013-10-16 NOTE — Progress Notes (Signed)
Pre visit review using our clinic review tool, if applicable. No additional management support is needed unless otherwise documented below in the visit note. 

## 2014-01-10 ENCOUNTER — Encounter: Payer: Self-pay | Admitting: Family Medicine

## 2014-01-10 ENCOUNTER — Ambulatory Visit (INDEPENDENT_AMBULATORY_CARE_PROVIDER_SITE_OTHER): Payer: 59 | Admitting: Family Medicine

## 2014-01-10 ENCOUNTER — Ambulatory Visit: Payer: Self-pay | Admitting: Family Medicine

## 2014-01-10 VITALS — BP 116/72 | HR 60 | Temp 98.2°F | Wt 212.2 lb

## 2014-01-10 DIAGNOSIS — N508 Other specified disorders of male genital organs: Secondary | ICD-10-CM

## 2014-01-10 DIAGNOSIS — N5082 Scrotal pain: Secondary | ICD-10-CM | POA: Insufficient documentation
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
## 2014-01-10 NOTE — Progress Notes (Signed)
Pre visit review using our clinic review tool, if applicable. No additional management support is needed unless otherwise documented below in the visit note. 

## 2014-01-10 NOTE — Patient Instructions (Signed)
I think you have groin strain. Continue ibuprofen 600mg  with meals and plenty of fluid. Pass by [facility]'s office to schedule ultrasound of scrotum to ensure normal. Elevate scrotum, no heavy lifting or training until feeling better. Let me know if not improving with above by mid next week.

## 2014-01-10 NOTE — Assessment & Plan Note (Signed)
Anticipate groin strain with some bleed. No evidence of hernia today. Some epididymal swelling noted. Will check scrotal US - r/o torsion and epididymitis. Continue ibuprofen, discussed elevating scrotum. Pt agrees with plan.

## 2014-01-13 ENCOUNTER — Encounter: Payer: Self-pay | Admitting: Family Medicine

## 2014-04-21 ENCOUNTER — Encounter: Payer: Self-pay | Admitting: Family Medicine

## 2014-04-21 ENCOUNTER — Ambulatory Visit (INDEPENDENT_AMBULATORY_CARE_PROVIDER_SITE_OTHER): Payer: 59 | Admitting: Family Medicine

## 2014-04-21 VITALS — BP 116/76 | HR 72 | Temp 98.4°F | Wt 215.0 lb

## 2014-04-21 DIAGNOSIS — F419 Anxiety disorder, unspecified: Secondary | ICD-10-CM | POA: Diagnosis not present

## 2014-04-21 DIAGNOSIS — E785 Hyperlipidemia, unspecified: Secondary | ICD-10-CM | POA: Insufficient documentation

## 2014-04-21 DIAGNOSIS — R202 Paresthesia of skin: Secondary | ICD-10-CM | POA: Insufficient documentation

## 2014-04-21 DIAGNOSIS — K219 Gastro-esophageal reflux disease without esophagitis: Secondary | ICD-10-CM

## 2014-04-21 LAB — LIPID PANEL
CHOLESTEROL: 198 mg/dL (ref 0–200)
HDL: 41.2 mg/dL (ref >39.00)
NonHDL: 156.8
Total CHOL/HDL Ratio: 5
Triglycerides: 226 mg/dL — ABNORMAL HIGH (ref 0.0–149.0)
VLDL: 45.2 mg/dL — AB (ref 0.0–40.0)

## 2014-04-21 LAB — VITAMIN B12: VITAMIN B 12: 446 pg/mL (ref 211–911)

## 2014-04-21 LAB — COMPREHENSIVE METABOLIC PANEL
ALK PHOS: 52 U/L (ref 39–117)
ALT: 19 U/L (ref 0–53)
AST: 18 U/L (ref 0–37)
Albumin: 4.6 g/dL (ref 3.5–5.2)
BILIRUBIN TOTAL: 1.2 mg/dL (ref 0.2–1.2)
BUN: 19 mg/dL (ref 6–23)
CO2: 27 meq/L (ref 19–32)
Calcium: 9.8 mg/dL (ref 8.4–10.5)
Chloride: 102 mEq/L (ref 96–112)
Creatinine, Ser: 1.1 mg/dL (ref 0.40–1.50)
GFR: 79.59 mL/min (ref >60.00)
Glucose, Bld: 97 mg/dL (ref 70–99)
Potassium: 3.9 mEq/L (ref 3.5–5.1)
SODIUM: 137 meq/L (ref 135–145)
Total Protein: 7.7 g/dL (ref 6.0–8.3)

## 2014-04-21 LAB — LDL CHOLESTEROL, DIRECT: Direct LDL: 116 mg/dL

## 2014-04-21 NOTE — Progress Notes (Signed)
BP 116/76 mmHg  Pulse 72  Temp(Src) 98.4 F (36.9 C) (Oral)  Wt 215 lb (97.523 kg)   CC: leg numbness, discuss GERD  Subjective:    Patient ID: Shaun Phillips, male    DOB: 02-09-1976, 39 y.o.   MRN: [id number]  HPI: Kaiser Belluomini is a 39 y.o. male presenting on 04/21/2014 for Numbness and Gastrophageal Reflux   2 month h/o L leg numbness/paresthesias worse at night time in bed. Numbness down posterior leg down to medial calf and toes. Intermittently happens. Denies back or buttock pain. Has tried ibuprofen but hasn't really helped. No weakness. Not exercises as much as he used to. 10 lb weight gain noted over last 6 months.  GERD - worsening over holidays, wonders if stress induced. Denies significant anxiety. Treated with 2 wk nexium course. Still feels tightening of stomach. Had EGD done 5 years ago and treated with zegerid and valium (to relax stomach muscles?).    Requests labwork as he's fasting.  Relevant past medical, surgical, family and social history reviewed and updated as indicated. Interim medical history since our last visit reviewed. Allergies and medications reviewed and updated. Current Outpatient Prescriptions on File Prior to Visit  Medication Sig  . LORazepam (ATIVAN) 1 MG tablet Take 0.5-1 tablets (0.5-1 mg total) by mouth 2 (two) times daily as needed for anxiety.   No current facility-administered medications on file prior to visit.    Review of Systems Per HPI unless specifically indicated above     Objective:    BP 116/76 mmHg  Pulse 72  Temp(Src) 98.4 F (36.9 C) (Oral)  Wt 215 lb (97.523 kg)  Wt Readings from Last 3 Encounters:  04/21/14 215 lb (97.523 kg)  01/10/14 212 lb 4 oz (96.276 kg)  10/16/13 205 lb 8 oz (93.214 kg)    Physical Exam  Constitutional: He is oriented to person, place, and time. He appears well-developed and well-nourished. No distress.  Abdominal: Soft. Bowel sounds are normal. He exhibits no distension and no mass. There is  no tenderness. There is no rebound and no guarding.  Musculoskeletal: He exhibits no edema.  No pain midline spine No paraspinous mm tenderness Neg SLR bilaterally. No pain with int/ext rotation at hip. Neg FABER. No pain at SIJ, GTB or sciatic notch bilaterally.   Neurological: He is alert and oriented to person, place, and time. He has normal strength. A sensory deficit is present. He displays a negative Romberg sign. Coordination and gait normal.  Able to heel and toe walk. Diminished sensation to light touch lateral left leg  Skin: Skin is warm and dry. No rash noted.  Psychiatric: He has a normal mood and affect.  Nursing note and vitals reviewed.      Assessment & Plan:   Problem List Items Addressed This Visit    Left leg paresthesias - Primary    Suspicious for lumbar radiculopathy but function remains intact. No pain, no weakness. Discussed stretching, exercise for goal weight loss, consider b complex vitamin. Check B12 level. Red flags to seek urgent care discussed. Pt agrees with plan.      Relevant Orders   Vitamin B12   HLD (hyperlipidemia)   Relevant Orders   Lipid panel   Comprehensive metabolic panel   GERD    Discussed PPI use - rec nexium QOD for next 2 weeks. Discussed GERD diet.      Relevant Medications   esomeprazole (NEXIUM) 20 MG capsule   Anxiety    Ok  to use lorazepam prn.          Follow up plan: Return as needed.

## 2014-04-21 NOTE — Assessment & Plan Note (Signed)
Suspicious for lumbar radiculopathy but function remains intact. No pain, no weakness. Discussed stretching, exercise for goal weight loss, consider b complex vitamin. Check B12 level. Red flags to seek urgent care discussed. Pt agrees with plan.

## 2014-04-21 NOTE — Assessment & Plan Note (Signed)
Ok to use lorazepam prn.

## 2014-04-21 NOTE — Progress Notes (Signed)
Pre visit review using our clinic review tool, if applicable. No additional management support is needed unless otherwise documented below in the visit note. 

## 2014-04-21 NOTE — Assessment & Plan Note (Signed)
Discussed PPI use - rec nexium QOD for next 2 weeks. Discussed GERD diet.

## 2014-04-21 NOTE — Patient Instructions (Addendum)
Circulation is doing well. I think this numbness is coming from pinching of a nerve root from the spine - fortunately no pain Watch for any worsening pain or any weakness developing - let us know if that happens. Otherwise just work on weight, exercise, stretching, and consider b complex vitamin.  Take nexium every other day for 2 weeks then stop, watch diet Ok to take ativan as needed sparingly for tight stomach sensation.  Pass by lab for blood work.

## 2014-04-22 ENCOUNTER — Encounter: Payer: Self-pay | Admitting: Family Medicine

## 2014-05-16 ENCOUNTER — Ambulatory Visit (INDEPENDENT_AMBULATORY_CARE_PROVIDER_SITE_OTHER): Payer: 59 | Admitting: Family Medicine

## 2014-05-16 ENCOUNTER — Encounter: Payer: Self-pay | Admitting: Family Medicine

## 2014-05-16 VITALS — BP 132/84 | HR 87 | Temp 98.4°F | Wt 213.0 lb

## 2014-05-16 DIAGNOSIS — H66002 Acute suppurative otitis media without spontaneous rupture of ear drum, left ear: Secondary | ICD-10-CM | POA: Diagnosis not present

## 2014-05-16 DIAGNOSIS — H6690 Otitis media, unspecified, unspecified ear: Secondary | ICD-10-CM | POA: Insufficient documentation

## 2014-05-16 MED ORDER — AMOXICILLIN-POT CLAVULANATE 875-125 MG PO TABS: 1.0000 | ORAL_TABLET | Freq: Two times a day (BID) | ORAL | Status: DC

## 2014-05-16 NOTE — Progress Notes (Signed)
Pre visit review using our clinic review tool, if applicable. No additional management support is needed unless otherwise documented below in the visit note.  Cold for the last week or so.  Has been taking otc mucinex D and tried allergy meds.  No relief.  Now with L facial pain, along the jaw, near the L ear.  No R sided pain.  L ear feels clogged.  The ear sx are clearly different and new in the last day or so.  No FCNAV.  No R ear pain.  Some cough, less than before.  He doesn't feel awful, the L facial pain is annoying.  L ear hearing is muffled.  No ST.    Meds, vitals, and allergies reviewed.   ROS: See HPI.  Otherwise, noncontributory.  GEN: nad, alert and oriented HEENT: mucous membranes moist, R tm w/o erythema, L TM red and bulging, nasal exam w/o erythema, clear discharge noted,  OP with cobblestoning, slightly puffy on L max sinus area but not ttp on sinuses x4, TMJ not ttp, mastoids not ttp NECK: supple w/o LA CV: rrr.   PULM: ctab, no inc wob

## 2014-05-16 NOTE — Patient Instructions (Signed)
Start augmentin, take tylenol or ibuprofen for pain and drink plenty of fluids.  This should gradually improve. Take care.

## 2014-05-18 NOTE — Assessment & Plan Note (Signed)
Augmentin, tylenol or ibuprofen with routine cautions, PO fluids and f/u prn.  He agrees.

## 2014-05-20 ENCOUNTER — Telehealth: Payer: Self-pay

## 2014-05-20 NOTE — Telephone Encounter (Signed)
Patient advised.

## 2014-05-20 NOTE — Telephone Encounter (Signed)
pt left v/m; pt was seen on 05/16/14 and pt has been taking abx since 05/16/14; the pain is gone but lt ear is still clogged and congested; now pts rt ear is clogged and congested and pts wife was dx on 05/19/14 with strep throat and now pts lymph node is swollen. Pt wants to know if could get different abx or does pt need to be reevaluated. Pt request cb. [facility].

## 2014-05-20 NOTE — Telephone Encounter (Signed)
augmentin should cover strep and he should have enough abx to take care of this.  I would continue as is. Can try OTC decongestant in meantime.  Rest and fluids o/w.  If worsening, and if needs recheck, then always glad to see patient again.   Thanks.

## 2014-05-29 ENCOUNTER — Encounter: Payer: Self-pay | Admitting: Family Medicine

## 2014-05-29 ENCOUNTER — Ambulatory Visit (INDEPENDENT_AMBULATORY_CARE_PROVIDER_SITE_OTHER): Payer: 59 | Admitting: Family Medicine

## 2014-05-29 VITALS — BP 122/86 | HR 68 | Temp 98.1°F | Wt 206.5 lb

## 2014-05-29 DIAGNOSIS — H6592 Unspecified nonsuppurative otitis media, left ear: Secondary | ICD-10-CM | POA: Diagnosis not present

## 2014-05-29 MED ORDER — FLUTICASONE PROPIONATE 50 MCG/ACT NA SUSP: 2.0000 | Freq: Every day | NASAL | Status: DC

## 2014-05-29 NOTE — Assessment & Plan Note (Signed)
Persistent left serous otitis after AOM.  rec continue claritin D, restart flonase, nasal saline, good hydration status. Update if not improving as expected with treatment after 3-4 wks. Pt agrees with plan.

## 2014-05-29 NOTE — Progress Notes (Signed)
Pre visit review using our clinic review tool, if applicable. No additional management support is needed unless otherwise documented below in the visit note. 

## 2014-05-29 NOTE — Patient Instructions (Signed)
I think you have persistent serous otitis of left ear.  Continue claritin D and restart flonase daily for next 2 weeks, and some nasal saline as needed. Hearing should slowly continue to improve, can last 3-4 weeks to fully resolve.  If persistent plugged ears into end of March, let us know for referral to ENT.   Serous Otitis Media Serous otitis media is fluid in the middle ear space. This space contains the bones for hearing and air. Air in the middle ear space helps to transmit sound.  The air gets there through the eustachian tube. This tube goes from the back of the nose (nasopharynx) to the middle ear space. It keeps the pressure in the middle ear the same as the outside world. It also helps to drain fluid from the middle ear space. CAUSES  Serous otitis media occurs when the eustachian tube gets blocked. Blockage can come from:  Ear infections.  Colds and other upper respiratory infections.  Allergies.  Irritants such as cigarette smoke.  Sudden changes in air pressure (such as descending in an airplane).  Enlarged adenoids.  A mass in the nasopharynx. During colds and upper respiratory infections, the middle ear space can become temporarily filled with fluid. This can happen after an ear infection also. Once the infection clears, the fluid will generally drain out of the ear through the eustachian tube. If it does not, then serous otitis media occurs. SIGNS AND SYMPTOMS   Hearing loss.  A feeling of fullness in the ear, without pain.  Young children may not show any symptoms but may show slight behavioral changes, such as agitation, ear pulling, or crying. DIAGNOSIS  Serous otitis media is diagnosed by an ear exam. Tests may be done to check on the movement of the eardrum. Hearing exams may also be done. TREATMENT  The fluid most often goes away without treatment. If allergy is the cause, allergy treatment may be helpful. Fluid that persists for several months may require  minor surgery. A small tube is placed in the eardrum to:  Drain the fluid.  Restore the air in the middle ear space. In certain situations, antibiotic medicines are used to avoid surgery. Surgery may be done to remove enlarged adenoids (if this is the cause). HOME CARE INSTRUCTIONS   Keep children away from tobacco smoke.  Keep all follow-up visits as directed by your health care provider. SEEK MEDICAL CARE IF:   Your hearing is not better in 3 months.  Your hearing is worse.  You have ear pain.  You have drainage from the ear.  You have dizziness.  You have serous otitis media only in one ear or have any bleeding from your nose (epistaxis).  You notice a lump on your neck. MAKE SURE YOU:  Understand these instructions.   Will watch your condition.   Will get help right away if you are not doing well or get worse.  Document Released: 06/11/2003 Document Revised: 08/05/2013 Document Reviewed: 10/16/2012 [facility] Patient Information 2015 [place], [place]. This information is not intended to replace advice given to you by your health care provider. Make sure you discuss any questions you have with your health care provider.

## 2014-05-29 NOTE — Progress Notes (Signed)
BP 122/86 mmHg  Pulse 68  Temp(Src) 98.1 F (36.7 C) (Oral)  Wt 206 lb 8 oz (93.668 kg)   CC: L otalgia  Subjective:    Patient ID: Shaun Phillips, male    DOB: 1975/10/15, 39 y.o.   MRN: [id number]  HPI: Shaun HeinzGavin Arp is a 39 y.o. male presenting on 05/29/2014 for Otalgia   Seen 05/16/2014 with dx left acute suppurative otitis media and treated with 10d augmentin course. 7d into abx course earache improved.   Has also been using claritin D which isn't really helping. Hasn't been using flonase regulalry.  No fevers/chills, sinus congestion, headache. Not significant purulent mucous anymore.   Relevant past medical, surgical, family and social history reviewed and updated as indicated. Interim medical history since our last visit reviewed. Allergies and medications reviewed and updated. Current Outpatient Prescriptions on File Prior to Visit  Medication Sig  . Multiple Vitamins-Minerals (MULTIVITAMIN & MINERAL PO) Take 1 tablet by mouth daily.  Marland Kitchen. esomeprazole (NEXIUM) 20 MG capsule Take 20 mg by mouth daily at 12 noon.  Marland Kitchen. LORazepam (ATIVAN) 1 MG tablet Take 0.5-1 tablets (0.5-1 mg total) by mouth 2 (two) times daily as needed for anxiety. (Patient not taking: Reported on 05/29/2014)   No current facility-administered medications on file prior to visit.    Review of Systems Per HPI unless specifically indicated above     Objective:    BP 122/86 mmHg  Pulse 68  Temp(Src) 98.1 F (36.7 C) (Oral)  Wt 206 lb 8 oz (93.668 kg)  Wt Readings from Last 3 Encounters:  05/29/14 206 lb 8 oz (93.668 kg)  05/16/14 213 lb (96.616 kg)  04/21/14 215 lb (97.523 kg)    Physical Exam  Constitutional: He appears well-developed and well-nourished. No distress.  HENT:  Head: Normocephalic and atraumatic.  Right Ear: Hearing, tympanic membrane, external ear and ear canal normal.  Left Ear: Hearing, tympanic membrane, external ear and ear canal normal.  Nose: Nose normal. No mucosal edema or  rhinorrhea. Right sinus exhibits no maxillary sinus tenderness and no frontal sinus tenderness. Left sinus exhibits no maxillary sinus tenderness and no frontal sinus tenderness.  Mouth/Throat: Uvula is midline, oropharynx is clear and moist and mucous membranes are normal. No oropharyngeal exudate, posterior oropharyngeal edema, posterior oropharyngeal erythema or tonsillar abscesses.  Mild congestion behind L>R TMs  Eyes: Conjunctivae and EOM are normal. Pupils are equal, round, and reactive to light. No scleral icterus.  Neck: Normal range of motion. Neck supple.  Lymphadenopathy:    He has no cervical adenopathy.  Skin: Skin is warm and dry. No rash noted.  Nursing note and vitals reviewed.  Results for orders placed or performed in visit on 04/21/14  Lipid panel  Result Value Ref Range   Cholesterol 198 0 - 200 mg/dL   Triglycerides 213.0226.0 (H) 0.0 - 149.0 mg/dL   HDL 86.5741.20 >84.69>39.00 mg/dL   VLDL 62.945.2 (H) 0.0 - 52.840.0 mg/dL   Total CHOL/HDL Ratio 5    NonHDL 156.80   Comprehensive metabolic panel  Result Value Ref Range   Sodium 137 135 - 145 mEq/L   Potassium 3.9 3.5 - 5.1 mEq/L   Chloride 102 96 - 112 mEq/L   CO2 27 19 - 32 mEq/L   Glucose, Bld 97 70 - 99 mg/dL   BUN 19 6 - 23 mg/dL   Creatinine, Ser 4.131.10 0.40 - 1.50 mg/dL   Total Bilirubin 1.2 0.2 - 1.2 mg/dL   Alkaline Phosphatase 52 39 -  117 U/L   AST 18 0 - 37 U/L   ALT 19 0 - 53 U/L   Total Protein 7.7 6.0 - 8.3 g/dL   Albumin 4.6 3.5 - 5.2 g/dL   Calcium 9.8 8.4 - 16.1 mg/dL   GFR 09.60 >45.40 mL/min  Vitamin B12  Result Value Ref Range   Vitamin B-12 446 211 - 911 pg/mL  LDL cholesterol, direct  Result Value Ref Range   Direct LDL 116.0 mg/dL      Assessment & Plan:   Problem List Items Addressed This Visit    Left serous otitis media - Primary    Persistent left serous otitis after AOM.  rec continue claritin D, restart flonase, nasal saline, good hydration status. Update if not improving as expected with  treatment after 3-4 wks. Pt agrees with plan.          Follow up plan: Return if symptoms worsen or fail to improve.

## 2014-12-29 ENCOUNTER — Telehealth: Payer: Self-pay | Admitting: Family Medicine

## 2014-12-29 ENCOUNTER — Encounter: Payer: Self-pay | Admitting: Cardiovascular Disease

## 2014-12-29 ENCOUNTER — Other Ambulatory Visit: Payer: Self-pay

## 2014-12-29 ENCOUNTER — Emergency Department
Admission: EM | Admit: 2014-12-29 | Discharge: 2014-12-29 | Disposition: A | Discharge status: Home / Self Care | Payer: 59 | Attending: Emergency Medicine | Admitting: Emergency Medicine

## 2014-12-29 ENCOUNTER — Ambulatory Visit: Payer: Self-pay | Admitting: Family Medicine

## 2014-12-29 ENCOUNTER — Ambulatory Visit (INDEPENDENT_AMBULATORY_CARE_PROVIDER_SITE_OTHER): Payer: 59 | Admitting: Cardiovascular Disease

## 2014-12-29 ENCOUNTER — Encounter: Payer: Self-pay | Admitting: Emergency Medicine

## 2014-12-29 VITALS — BP 138/82 | HR 80 | Ht 70.0 in | Wt 214.5 lb

## 2014-12-29 DIAGNOSIS — R55 Syncope and collapse: Secondary | ICD-10-CM

## 2014-12-29 DIAGNOSIS — H538 Other visual disturbances: Secondary | ICD-10-CM | POA: Diagnosis not present

## 2014-12-29 DIAGNOSIS — Z87891 Personal history of nicotine dependence: Secondary | ICD-10-CM | POA: Insufficient documentation

## 2014-12-29 LAB — URINALYSIS COMPLETE WITH MICROSCOPIC (ARMC ONLY)
BILIRUBIN URINE: NEGATIVE
Bacteria, UA: NONE SEEN
Glucose, UA: NEGATIVE mg/dL
Hgb urine dipstick: NEGATIVE
KETONES UR: NEGATIVE mg/dL
Leukocytes, UA: NEGATIVE
Nitrite: NEGATIVE
Protein, ur: NEGATIVE mg/dL
SQUAMOUS EPITHELIAL / LPF: NONE SEEN
Specific Gravity, Urine: 1.013 (ref 1.005–1.030)
pH: 5 (ref 5.0–8.0)

## 2014-12-29 LAB — BASIC METABOLIC PANEL
Anion gap: 5 (ref 5–15)
BUN: 22 mg/dL — AB (ref 6–20)
CO2: 31 mmol/L (ref 22–32)
CREATININE: 1.17 mg/dL (ref 0.61–1.24)
Calcium: 10.1 mg/dL (ref 8.9–10.3)
Chloride: 101 mmol/L (ref 101–111)
GFR calc Af Amer: >60 mL/min (ref >60)
GFR calc non Af Amer: >60 mL/min (ref >60)
Glucose, Bld: 106 mg/dL — ABNORMAL HIGH (ref 65–99)
Potassium: 4.4 mmol/L (ref 3.5–5.1)
SODIUM: 137 mmol/L (ref 135–145)

## 2014-12-29 LAB — CBC
HCT: 46.3 % (ref 40.0–52.0)
Hemoglobin: 16 g/dL (ref 13.0–18.0)
MCH: 29.6 pg (ref 26.0–34.0)
MCHC: 34.7 g/dL (ref 32.0–36.0)
MCV: 85.3 fL (ref 80.0–100.0)
PLATELETS: 259 10*3/uL (ref 150–440)
RBC: 5.43 MIL/uL (ref 4.40–5.90)
RDW: 12.8 % (ref 11.5–14.5)
WBC: 7.9 10*3/uL (ref 3.8–10.6)

## 2014-12-29 LAB — TROPONIN I: Troponin I: <0.03 ng/mL (ref <0.031)

## 2014-12-29 MED ORDER — SODIUM CHLORIDE 0.9 % IV SOLN
Freq: Once | INTRAVENOUS | Status: AC
  Administered 2014-12-29: 12:00:00 via INTRAVENOUS

## 2014-12-29 NOTE — ED Notes (Signed)
Pt presents with near syncope on Friday, Saturday and Sunday. Pt reports feelings of dizziness and blurred vision at time of these episodes. Dr sent over for further eval.

## 2014-12-29 NOTE — Patient Instructions (Signed)
Medication Instructions:  Your physician recommends that you continue on your current medications as directed. Please refer to the Current Medication list given to you today.   Labwork: none  Testing/Procedures: Your physician has requested that you have an echocardiogram. Echocardiography is a painless test that uses sound waves to create images of your heart. It provides your doctor with information about the size and shape of your heart and how well your heart's chambers and valves are working. This procedure takes approximately one hour. There are no restrictions for this procedure.  Your physician has recommended that you wear a holter monitor. Holter monitors are medical devices that record the heart's electrical activity. Doctors most often use these monitors to diagnose arrhythmias. Arrhythmias are problems with the speed or rhythm of the heartbeat. The monitor is a small, portable device. You can wear one while you do your normal daily activities. This is usually used to diagnose what is causing palpitations/syncope (passing out).    Follow-Up: Your physician recommends that you schedule a follow-up appointment with Dr. Arida as needed.   Any Other Special Instructions Will Be Listed Below (If Applicable).  Echocardiogram An echocardiogram, or echocardiography, uses sound waves (ultrasound) to produce an image of your heart. The echocardiogram is simple, painless, obtained within a short period of time, and offers valuable information to your health care provider. The images from an echocardiogram can provide information such as:  Evidence of coronary artery disease (CAD).  Heart size.  Heart muscle function.  Heart valve function.  Aneurysm detection.  Evidence of a past heart attack.  Fluid buildup around the heart.  Heart muscle thickening.  Assess heart valve function. LET YOUR HEALTH CARE PROVIDER KNOW ABOUT:  Any allergies you have.  All medicines you are  taking, including vitamins, herbs, eye drops, creams, and over-the-counter medicines.  Previous problems you or members of your family have had with the use of anesthetics.  Any blood disorders you have.  Previous surgeries you have had.  Medical conditions you have.  Possibility of pregnancy, if this applies. BEFORE THE PROCEDURE  No special preparation is needed. Eat and drink normally.  PROCEDURE   In order to produce an image of your heart, gel will be applied to your chest and a wand-like tool (transducer) will be moved over your chest. The gel will help transmit the sound waves from the transducer. The sound waves will harmlessly bounce off your heart to allow the heart images to be captured in real-time motion. These images will then be recorded.  You may need an IV to receive a medicine that improves the quality of the pictures. AFTER THE PROCEDURE You may return to your normal schedule including diet, activities, and medicines, unless your health care provider tells you otherwise. Document Released: 03/18/2000 Document Revised: 08/05/2013 Document Reviewed: 11/26/2012 Exit[employer] Patient Information 2015 [employer]. This information is not intended to replace advice given to you by your health care provider. Make sure you discuss any questions you have with your health care provider. Cardiac Event Monitoring A cardiac event monitor is a small recording device used to help detect abnormal heart rhythms (arrhythmias). The monitor is used to record heart rhythm when noticeable symptoms such as the following occur:  Fast heartbeats (palpitations), such as heart racing or fluttering.  Dizziness.  Fainting or light-headedness.  Unexplained weakness. The monitor is wired to two electrodes placed on your chest. Electrodes are flat, sticky disks that attach to your skin. The monitor can be worn   for up to 30 days. You will wear the monitor at all times, except when bathing.  HOW TO  USE YOUR CARDIAC EVENT MONITOR A technician will prepare your chest for the electrode placement. The technician will show you how to place the electrodes, how to work the monitor, and how to replace the batteries. Take time to practice using the monitor before you leave the office. Make sure you understand how to send the information from the monitor to your health care provider. This requires a telephone with a landline, not a cell phone. You need to:  Wear your monitor at all times, except when you are in water:  Do not get the monitor wet.  Take the monitor off when bathing. Do not swim or use a hot tub with it on.  Keep your skin clean. Do not put body lotion or moisturizer on your chest.  Change the electrodes daily or any time they stop sticking to your skin. You might need to use tape to keep them on.  It is possible that your skin under the electrodes could become irritated. To keep this from happening, try to put the electrodes in slightly different places on your chest. However, they must remain in the area under your left breast and in the upper right section of your chest.  Make sure the monitor is safely clipped to your clothing or in a location close to your body that your health care provider recommends.  Press the button to record when you feel symptoms of heart trouble, such as dizziness, weakness, light-headedness, palpitations, thumping, shortness of breath, unexplained weakness, or a fluttering or racing heart. The monitor is always on and records what happened slightly before you pressed the button, so do not worry about being too late to get good information.  Keep a diary of your activities, such as walking, doing chores, and taking medicine. It is especially important to note what you were doing when you pushed the button to record your symptoms. This will help your health care provider determine what might be contributing to your symptoms. The information stored in your  monitor will be reviewed by your health care provider alongside your diary entries.  Send the recorded information as recommended by your health care provider. It is important to understand that it will take some time for your health care provider to process the results.  Change the batteries as recommended by your health care provider. SEEK IMMEDIATE MEDICAL CARE IF:   You have chest pain.  You have extreme difficulty breathing or shortness of breath.  You develop a very fast heartbeat that persists.  You develop dizziness that does not go away.  You faint or constantly feel you are about to faint. Document Released: 12/29/2007 Document Revised: 08/05/2013 Document Reviewed: 09/17/2012 ExitCare Patient Information 2015 [employer]. This information is not intended to replace advice given to you by your health care provider. Make sure you discuss any questions you have with your health care provider.  

## 2014-12-29 NOTE — ED Provider Notes (Signed)
[facility] Emergency Department Provider Note     Time seen: ----------------------------------------- 11:01 AM on 12/29/2014 -----------------------------------------    I have reviewed the triage vital signs and the nursing notes.   HISTORY  Chief Complaint Near Syncope    HPI Shaun Phillips is a 39 y.o. male who presents ER with near syncope on Friday Saturday and Sunday. Today is currently Monday, he reports feeling dizzy and has had blurred vision at the time of these episodes. He was sent by his doctor for further evaluation. Patient states these events occur if he is sitting or standing, he has not been able to tie them to anything in particular. Patient states this does not feel like a typical panic attack for him.   Past Medical History  Diagnosis Date  . GERD (gastroesophageal reflux disease)   . Anxiety   . Allergy   . HLD (hyperlipidemia)     Patient Active Problem List   Diagnosis Date Noted  . Left serous otitis media 05/29/2014  . Left leg paresthesias 04/21/2014  . HLD (hyperlipidemia) 04/21/2014  . Scrotal pain 01/10/2014  . Routine general medical examination at a health care facility 04/25/2012  . Atypical chest pain 04/25/2012  . Anxiety 11/02/2007  . ALLERGIC RHINITIS 11/02/2007  . GERD 11/02/2007    Past Surgical History  Procedure Laterality Date  . Esophagogastroduodenoscopy  03/09    HIATAL HERNIA AND GERD  . Ankle surgery  2004    left  . Finger surgery  1995    left 2nd finger repair    Allergies Ciprofloxacin  Social History Social History  Substance Use Topics  . Smoking status: Former Smoker    Quit date: 04/05/2011  . Smokeless tobacco: Never Used  . Alcohol Use: Yes     Comment: intermittent beers -- esp if out with friends    Review of Systems Constitutional: Negative for fever. Eyes: Positive for blurry vision ENT: Negative for sore throat. Cardiovascular: Negative for chest  pain. Respiratory: Negative for shortness of breath. Gastrointestinal: Negative for abdominal pain, vomiting and diarrhea. Genitourinary: Negative for dysuria. Musculoskeletal: Negative for back pain. Skin: Negative for rash. Neurological: Negative for headaches, focal weakness or numbness.  10-point ROS otherwise negative.  ____________________________________________   PHYSICAL EXAM:  VITAL SIGNS: ED Triage Vitals  Enc Vitals Group     BP 12/29/14 1020 139/83 mmHg     Pulse Rate 12/29/14 1020 87     Resp 12/29/14 1020 20     Temp 12/29/14 1020 98.7 F (37.1 C)     Temp Source 12/29/14 1020 Oral     SpO2 12/29/14 1020 97 %     Weight 12/29/14 1020 211 lb (95.709 kg)     Height 12/29/14 1020  (1.778 m)     Head Cir --      Peak Flow --      Pain Score --      Pain Loc --      Pain Edu? --      Excl. in GC? --     Constitutional: Alert and oriented. Well appearing and in no distress. Eyes: Conjunctivae are normal. PERRL. Normal extraocular movements. ENT   Head: Normocephalic and atraumatic.   Nose: No congestion/rhinnorhea.   Mouth/Throat: Mucous membranes are moist.   Neck: No stridor. Cardiovascular: Normal rate, regular rhythm. Normal and symmetric distal pulses are present in all extremities. No murmurs, rubs, or gallops. Respiratory: Normal respiratory effort without tachypnea nor retractions. Breath sounds  are clear and equal bilaterally. No wheezes/rales/rhonchi. Gastrointestinal: Soft and nontender. No distention. No abdominal bruits.  Musculoskeletal: Nontender with normal range of motion in all extremities. No joint effusions.  No lower extremity tenderness nor edema. Neurologic:  Normal speech and language. No gross focal neurologic deficits are appreciated. Speech is normal. No gait instability. Skin:  Skin is warm, dry and intact. No rash noted. Psychiatric: Mood and affect are normal. Speech and behavior are normal. Patient exhibits  appropriate insight and judgment. ____________________________________________  EKG: Interpreted by me. Normal sinus rhythm with a rate of 81 bpm, incomplete right bundle branch block, normal axis. No evidence of acute infarction.  ____________________________________________  ED COURSE:  Pertinent labs & imaging results that were available during my care of the patient were reviewed by me and considered in my medical decision making (see chart for details). No clear etiology is identified. He will receive a liter of saline bolus and we will reevaluate. ____________________________________________    LABS (pertinent positives/negatives)  Labs Reviewed  BASIC METABOLIC PANEL - Abnormal; Notable for the following:    Glucose, Bld 106 (*)    BUN 22 (*)    All other components within normal limits  URINALYSIS COMPLETEWITH MICROSCOPIC ([facility] ONLY) - Abnormal; Notable for the following:    Color, Urine YELLOW (*)    APPearance CLEAR (*)    All other components within normal limits  CBC  TROPONIN I   ____________________________________________  FINAL ASSESSMENT AND PLAN  Near syncope  Plan: Patient with labs and imaging as dictated above. Patient with near syncopal event, unclear etiology. His labs have been unremarkable here and he is not orthostatic. He is advised to hold the decongestant medications that he is been taking. He is going to have close follow-up with cardiology for reevaluation.   Emily Filbert, MD   Emily Filbert, MD 12/29/14 1310

## 2014-12-29 NOTE — Assessment & Plan Note (Signed)
The patient describes 3 presyncopal episodes since Friday. The etiology of this is not entirely clear. He is not orthostatic today. His labs at the emergency room were only remarkable for mildly elevated BUN and there might have been a component of some volume depletion. Given that he had recurrent episodes in the last 2 days, I requested a 48-hour Holter monitor to evaluate for possible arrhythmia and an echocardiogram. If cardiac workup is negative and he continues to have these episodes, he might require further evaluation. The improvement with consuming food might suggest episodes of hypoglycemia. However, he is not diabetic. He describes some problems with balance during these episodes and might require ENT evaluation.

## 2014-12-29 NOTE — Telephone Encounter (Signed)
Patient Name: Shaun Phillips  DOB: January 07, 1976    Initial Comment Caller states vision goes blurry and he feels like he is going to pass out    Nurse Assessment  Nurse: Scarlette Ar, RN, Herbert Seta Date/Time (Eastern Time): 12/29/2014 9:34:17 AM  Confirm and document reason for call. If symptomatic, describe symptoms. ---Caller states that starting last Friday he has had episodes where he gets dizzy and feels like he is going to pass out. He started with blurry vision.  Has the patient traveled out of the country within the last 30 days? ---Not Applicable  Does the patient require triage? ---Yes  Related visit to physician within the last 2 weeks? ---No  Does the PT have any chronic conditions? (i.e. diabetes, asthma, etc.) ---Yes  List chronic conditions. ---anxiety     Guidelines    Guideline Title Affirmed Question Affirmed Notes  Dizziness - Lightheadedness Difficulty breathing    Final Disposition User   Go to ED Now Standifer, RN, Herbert Seta    Disagree/Comply: Comply

## 2014-12-29 NOTE — Discharge Instructions (Signed)
Near-Syncope Near-syncope (commonly known as near fainting) is sudden weakness, dizziness, or feeling like you might pass out. During an episode of near-syncope, you may also develop pale skin, have tunnel vision, or feel sick to your stomach (nauseous). Near-syncope may occur when getting up after sitting or while standing for a long time. It is caused by a sudden decrease in blood flow to the brain. This decrease can result from various causes or triggers, most of which are not serious. However, because near-syncope can sometimes be a sign of something serious, a medical evaluation is required. The specific cause is often not determined. HOME CARE INSTRUCTIONS  Monitor your condition for any changes. The following actions may help to alleviate any discomfort you are experiencing:  Have someone stay with you until you feel stable.  Lie down right away and prop your feet up if you start feeling like you might faint. Breathe deeply and steadily. Wait until all the symptoms have passed. Most of these episodes last only a few minutes. You may feel tired for several hours.   Drink enough fluids to keep your urine clear or pale yellow.   If you are taking blood pressure or heart medicine, get up slowly when seated or lying down. Take several minutes to sit and then stand. This can reduce dizziness.  Follow up with your health care provider as directed. SEEK IMMEDIATE MEDICAL CARE IF:   You have a severe headache.   You have unusual pain in the chest, abdomen, or back.   You are bleeding from the mouth or rectum, or you have black or tarry stool.   You have an irregular or very fast heartbeat.   You have repeated fainting or have seizure-like jerking during an episode.   You faint when sitting or lying down.   You have confusion.   You have difficulty walking.   You have severe weakness.   You have vision problems.  MAKE SURE YOU:   Understand these instructions.  Will  watch your condition.  Will get help right away if you are not doing well or get worse. Document Released: 03/21/2005 Document Revised: 03/26/2013 Document Reviewed: 08/24/2012 Exit[employer] Patient Information 2015 [employer]. This information is not intended to replace advice given to you by your health care provider. Make sure you discuss any questions you have with your health care provider.  

## 2014-12-29 NOTE — Progress Notes (Signed)
Primary care physician: Dr. Sharen Hones  HPI  This is a pleasant 39 year old male who was referred from the emergency room at [facility] for evaluation of presyncope. He has no previous cardiac history and no significant chronic medical conditions. He does report anxiety disorder with previous panic attacks when he was under significant stress. Over the last few days, he has experienced sudden episodes of extreme dizziness and almost completely passing out. This started Friday while he was at the store in probably. He was walking and all of a sudden felt very dizzy with presyncope. He thought that his blood sugar might be low and thus he had a soft drink and some food with subsequent resolution. He had another episode on Saturday. On Sunday he had a similar episode while he was sitting behind a desk working on his computer. During these episodes, he did not lose consciousness completely. There was no vertigo. He did not have any chest pain, shortness of breath or palpitations. He reports that these episodes are not the same as his previous anxiety attacks. He does take allergy medications but he is not aware of palpitations. He denies any increased stress recently. He usually exercises regularly but stopped 5 weeks ago after a right ankle injury. He has no family history of coronary artery disease or sudden cardiac death. He is a previous smoker and quit about 7 years ago. He drinks alcohol socially and denies any recreational drug use. He works at [employer]. Basic workup in the emergency room today was unremarkable except for mildly elevated BUN. He was given 1 L of fluid.  Allergies  Allergen Reactions  . Ciprofloxacin     REACTION: ANAPHYLACTIC LIKE REACTION     Current Outpatient Prescriptions on File Prior to Visit  Medication Sig Dispense Refill  . fluticasone (FLONASE) 50 MCG/ACT nasal spray Place 2 sprays into both nostrils daily. 16 g 3  . loratadine (CLARITIN) 10 MG tablet Take 10 mg by mouth daily.      . Multiple Vitamins-Minerals (MULTIVITAMIN & MINERAL PO) Take 1 tablet by mouth daily.    Marland Kitchen omega-3 acid ethyl esters (LOVAZA) 1 G capsule Take 1 g by mouth 2 (two) times daily.     No current facility-administered medications on file prior to visit.     Past Medical History  Diagnosis Date  . GERD (gastroesophageal reflux disease)   . Anxiety   . Allergy   . HLD (hyperlipidemia)   . Heart murmur     Hx as child  . Asthma     Hx as child     Past Surgical History  Procedure Laterality Date  . Esophagogastroduodenoscopy  03/09    HIATAL HERNIA AND GERD  . Ankle surgery  2004    left  . Finger surgery  1995    left 2nd finger repair     Family History  Problem Relation Age of Onset  . Depression Mother   . Diabetes Father   . Hypertension Paternal Grandmother   . CAD Paternal Grandmother   . Hypertension Paternal Grandfather   . CAD Paternal Grandfather   . Cancer Neg Hx     prostate or colon     Social History   Social History  . Marital Status: Married    Spouse Name: N/A  . Number of Children: 1  . Years of Education: N/A   Occupational History  . Curator for [employer]    Social History Main Topics  . Smoking status: Former Smoker  Quit date: 04/05/2011  . Smokeless tobacco: Never Used  . Alcohol Use: Yes     Comment: intermittent beers -- esp if out with friends  . Drug Use: No  . Sexual Activity: Not on file   Other Topics Concern  . Not on file   Social History Narrative   2 sons     ROS A 10 point review of system was performed. It is negative other than that mentioned in the history of present illness.   PHYSICAL EXAM   BP 138/82 mmHg  Pulse 80  Ht  (1.778 m)  Wt 214 lb 8 oz (97.297 kg)  BMI 30.78 kg/m2 Constitutional: He is oriented to person, place, and time. He appears well-developed and well-nourished. No distress.  HENT: No nasal discharge.  Head: Normocephalic and atraumatic.  Eyes: Pupils are equal and  round.  No discharge. Neck: Normal range of motion. Neck supple. No JVD present. No thyromegaly present.  Cardiovascular: Normal rate, regular rhythm, normal heart sounds. Exam reveals no gallop and no friction rub. No murmur heard.  Pulmonary/Chest: Effort normal and breath sounds normal. No stridor. No respiratory distress. He has no wheezes. He has no rales. He exhibits no tenderness.  Abdominal: Soft. Bowel sounds are normal. He exhibits no distension. There is no tenderness. There is no rebound and no guarding.  Musculoskeletal: Normal range of motion. He exhibits no edema and no tenderness.  Neurological: He is alert and oriented to person, place, and time. Coordination normal.  Skin: Skin is warm and dry. No rash noted. He is not diaphoretic. No erythema. No pallor.  Psychiatric: He has a normal mood and affect. His behavior is normal. Judgment and thought content normal.       EKG: EKG from today was reviewed which showed normal sinus rhythm with incomplete right bundle branch block.   ASSESSMENT AND PLAN

## 2014-12-30 ENCOUNTER — Ambulatory Visit (INDEPENDENT_AMBULATORY_CARE_PROVIDER_SITE_OTHER): Payer: 59

## 2014-12-30 DIAGNOSIS — R55 Syncope and collapse: Secondary | ICD-10-CM | POA: Diagnosis not present

## 2014-12-30 NOTE — Telephone Encounter (Signed)
Patient evaluated at ED 9/26.

## 2015-01-05 ENCOUNTER — Ambulatory Visit (INDEPENDENT_AMBULATORY_CARE_PROVIDER_SITE_OTHER): Payer: 59

## 2015-01-05 ENCOUNTER — Other Ambulatory Visit: Payer: Self-pay

## 2015-01-05 DIAGNOSIS — R55 Syncope and collapse: Secondary | ICD-10-CM | POA: Diagnosis not present

## 2015-03-03 ENCOUNTER — Ambulatory Visit (INDEPENDENT_AMBULATORY_CARE_PROVIDER_SITE_OTHER): Payer: 59 | Admitting: Family Medicine

## 2015-03-03 ENCOUNTER — Encounter: Payer: Self-pay | Admitting: Family Medicine

## 2015-03-03 VITALS — BP 116/76 | HR 87 | Temp 98.2°F | Ht 70.0 in | Wt 216.2 lb

## 2015-03-03 DIAGNOSIS — R059 Cough, unspecified: Secondary | ICD-10-CM

## 2015-03-03 DIAGNOSIS — R05 Cough: Secondary | ICD-10-CM

## 2015-03-03 MED ORDER — AMOXICILLIN 500 MG PO CAPS: 1000.0000 mg | ORAL_CAPSULE | Freq: Two times a day (BID) | ORAL | Status: DC

## 2015-03-03 NOTE — Patient Instructions (Signed)
Mucinex DM  twice daily to break up mucus.  Nasal saline spray  3-4 times a day or  Irrigation 1-3 times day.  Consider starting flonase 2 sprays per nostril daily.  If not improivng in 3-4 days fill antibiotics.

## 2015-03-03 NOTE — Progress Notes (Signed)
Pre visit review using our clinic review tool, if applicable. No additional management support is needed unless otherwise documented below in the visit note. 

## 2015-03-03 NOTE — Assessment & Plan Note (Signed)
Most likely viral in origin. Explain course of  Viral infection with pt and concern about overuse of antibiotics.  Treat aggressively with mucolytic, nasal steroid and irrigation. If not improving in 3-4 days.. Fill antibiotics.

## 2015-03-03 NOTE — Progress Notes (Signed)
   Subjective:    Patient ID: Shaun Phillips, male    DOB: 1975/08/01, 39 y.o.   MRN: [id number]  Fever  This is a new problem. The current episode started in the past 7 days (6 days ago). The problem has been waxing and waning. The maximum temperature noted was 101 to 101.9 F (subjective fever, chills.). Associated symptoms include congestion and coughing. Pertinent negatives include no ear pain or wheezing. He has tried NSAIDs for the symptoms. The treatment provided moderate relief.  Cough This is a new problem. The current episode started in the past 7 days. The problem has been gradually worsening. The cough is productive of sputum. Associated symptoms include a fever, nasal congestion and postnasal drip. Pertinent negatives include no ear pain, hemoptysis, shortness of breath or wheezing. Associated symptoms comments: No sinus pain. Exacerbated by: cough keeping him up at night. Risk factors: former smoker, 10 pack year history. He has tried OTC cough suppressant for the symptoms. The treatment provided mild relief. His past medical history is significant for asthma and environmental allergies. There is no history of bronchiectasis, bronchitis, COPD, emphysema or pneumonia.      Review of Systems  Constitutional: Positive for fever.  HENT: Positive for congestion and postnasal drip. Negative for ear pain.   Respiratory: Positive for cough. Negative for hemoptysis, shortness of breath and wheezing.   Allergic/Immunologic: Positive for environmental allergies.       Objective:   Physical Exam  Constitutional: Vital signs are normal. He appears well-developed and well-nourished.  Non-toxic appearance. He does not appear ill. No distress.  HENT:  Head: Normocephalic and atraumatic.  Right Ear: Hearing, tympanic membrane, external ear and ear canal normal. No tenderness. No foreign bodies. Tympanic membrane is not retracted and not bulging.  Left Ear: Hearing, tympanic membrane, external ear  and ear canal normal. No tenderness. No foreign bodies. Tympanic membrane is not retracted and not bulging.  Nose: Nose normal. No mucosal edema or rhinorrhea. Right sinus exhibits no maxillary sinus tenderness and no frontal sinus tenderness. Left sinus exhibits no maxillary sinus tenderness and no frontal sinus tenderness.  Mouth/Throat: Uvula is midline, oropharynx is clear and moist and mucous membranes are normal. Normal dentition. No dental caries. No oropharyngeal exudate or tonsillar abscesses.  Eyes: Conjunctivae, EOM and lids are normal. Pupils are equal, round, and reactive to light. Lids are everted and swept, no foreign bodies found.  Neck: Trachea normal, normal range of motion and phonation normal. Neck supple. Carotid bruit is not present. No thyroid mass and no thyromegaly present.  Cardiovascular: Normal rate, regular rhythm, S1 normal, S2 normal, normal heart sounds, intact distal pulses and normal pulses.  Exam reveals no gallop.   No murmur heard. Pulmonary/Chest: Effort normal and breath sounds normal. No respiratory distress. He has no wheezes. He has no rhonchi. He has no rales.  Abdominal: Soft. Normal appearance and bowel sounds are normal. There is no hepatosplenomegaly. There is no tenderness. There is no rebound, no guarding and no CVA tenderness. No hernia.  Neurological: He is alert. He has normal reflexes.  Skin: Skin is warm, dry and intact. No rash noted.  Psychiatric: He has a normal mood and affect. His speech is normal and behavior is normal. Judgment normal.          Assessment & Plan:

## 2015-03-19 ENCOUNTER — Ambulatory Visit (INDEPENDENT_AMBULATORY_CARE_PROVIDER_SITE_OTHER): Payer: 59 | Admitting: Family Medicine

## 2015-03-19 ENCOUNTER — Encounter: Payer: Self-pay | Admitting: Family Medicine

## 2015-03-19 VITALS — BP 110/80 | HR 84 | Temp 97.6°F | Wt 214.2 lb

## 2015-03-19 DIAGNOSIS — A084 Viral intestinal infection, unspecified: Secondary | ICD-10-CM

## 2015-03-19 NOTE — Progress Notes (Signed)
Pre visit review using our clinic review tool, if applicable. No additional management support is needed unless otherwise documented below in the visit note. 

## 2015-03-19 NOTE — Assessment & Plan Note (Signed)
Anticipate self limited viral enteritis after recent viral URI vs mucinex related diarrhea/colon irritation. rec continued pushing fluids, bland diet, and start probiotic daily. Update if not resolved for further eval (consider stool studies if persistent diarrhea). Pt agrees with plan.

## 2015-03-19 NOTE — Progress Notes (Signed)
BP 110/80 mmHg  Pulse 84  Temp(Src) 97.6 F (36.4 C) (Oral)  Wt 214 lb 4 oz (97.183 kg)   CC: diarrhea  Subjective:    Patient ID: Shaun Phillips, male    DOB: 07-03-1975, 39 y.o.   MRN: [id number]  HPI: Shaun HeinzGavin Salek is a 39 y.o. male presenting on 03/19/2015 for Diarrhea   Seen here 11/29 with cough, thought viral. Supportive care recommended including mucinex D 600mg , provided with amox WASP. He did not need this.   Now over last 1.5 wks noticing loose stools/diarrhea with mucous and possibly blood. Mild GI upset. Increased frequency (6-7 stools/day). Tried immodium AD which didn't help. Stays well hydrated. Last stool this morning. Not watery diarrhea.   No recent fevers/chills, vomiting. No unexpected weight changes. Appetite ok.   He had been taking 2 tablets of mucinex D twice daily (per instructions). Last mucinex D was >1 wk ago.   No fmhx colon issues.   Relevant past medical, surgical, family and social history reviewed and updated as indicated. Interim medical history since our last visit reviewed. Allergies and medications reviewed and updated. Current Outpatient Prescriptions on File Prior to Visit  Medication Sig  . Multiple Vitamins-Minerals (MULTIVITAMIN & MINERAL PO) Take 1 tablet by mouth daily. Reported on 03/19/2015  . omega-3 acid ethyl esters (LOVAZA) 1 G capsule Take 1 g by mouth 2 (two) times daily. Reported on 03/19/2015   No current facility-administered medications on file prior to visit.    Review of Systems Per HPI unless specifically indicated in ROS section     Objective:    BP 110/80 mmHg  Pulse 84  Temp(Src) 97.6 F (36.4 C) (Oral)  Wt 214 lb 4 oz (97.183 kg)  Wt Readings from Last 3 Encounters:  03/19/15 214 lb 4 oz (97.183 kg)  03/03/15 216 lb 4 oz (98.09 kg)  12/29/14 214 lb 8 oz (97.297 kg)    Physical Exam  Constitutional: He appears well-developed and well-nourished. No distress.  Nontoxic, well appearing  HENT:  Head:  Normocephalic and atraumatic.  Mouth/Throat: Uvula is midline, oropharynx is clear and moist and mucous membranes are normal. No oropharyngeal exudate, posterior oropharyngeal edema, posterior oropharyngeal erythema or tonsillar abscesses.  Eyes: Conjunctivae and EOM are normal. Pupils are equal, round, and reactive to light. No scleral icterus.  Neck: Normal range of motion. Neck supple.  Cardiovascular: Normal rate, regular rhythm, normal heart sounds and intact distal pulses.   No murmur heard. Pulmonary/Chest: Effort normal and breath sounds normal. No respiratory distress. He has no wheezes. He has no rales.  Abdominal: Soft. Normal appearance and bowel sounds are normal. He exhibits no distension and no mass. There is no hepatosplenomegaly. There is no tenderness. There is no rigidity, no rebound, no guarding, no CVA tenderness and negative Murphy's sign.  Musculoskeletal: He exhibits no edema.  Skin: Skin is warm and dry. No rash noted.  Psychiatric: He has a normal mood and affect.  Nursing note and vitals reviewed.      Assessment & Plan:   Problem List Items Addressed This Visit    Viral enteritis - Primary    Anticipate self limited viral enteritis after recent viral URI vs mucinex related diarrhea/colon irritation. rec continued pushing fluids, bland diet, and start probiotic daily. Update if not resolved for further eval (consider stool studies if persistent diarrhea). Pt agrees with plan.          Follow up plan: Return as needed, for follow up visit.

## 2015-03-19 NOTE — Patient Instructions (Signed)
I think this may be residual from recent viral infection. Recommend bland diet for next 2-3 days and start philips colon health probiotic for 1-2 weeks.  Let us know if not resolved with this.

## 2015-11-04 ENCOUNTER — Ambulatory Visit (INDEPENDENT_AMBULATORY_CARE_PROVIDER_SITE_OTHER): Payer: 59 | Admitting: Family Medicine

## 2015-11-04 ENCOUNTER — Encounter: Payer: Self-pay | Admitting: Family Medicine

## 2015-11-04 VITALS — BP 122/78 | HR 88 | Temp 98.2°F | Wt 217.8 lb

## 2015-11-04 DIAGNOSIS — R1031 Right lower quadrant pain: Secondary | ICD-10-CM | POA: Diagnosis not present

## 2015-11-04 DIAGNOSIS — R1030 Lower abdominal pain, unspecified: Secondary | ICD-10-CM | POA: Insufficient documentation

## 2015-11-04 LAB — POC URINALSYSI DIPSTICK (AUTOMATED)
BILIRUBIN UA: NEGATIVE
GLUCOSE UA: NEGATIVE
Ketones, UA: NEGATIVE
Leukocytes, UA: NEGATIVE
NITRITE UA: NEGATIVE
PH UA: 6
Protein, UA: NEGATIVE
RBC UA: NEGATIVE
SPEC GRAV UA: 1.02
Urobilinogen, UA: 0.2

## 2015-11-04 MED ORDER — SULFAMETHOXAZOLE-TRIMETHOPRIM 800-160 MG PO TABS: 1.0000 | ORAL_TABLET | Freq: Two times a day (BID) | ORAL | 0 refills | Status: DC

## 2015-11-04 MED ORDER — NAPROXEN 500 MG PO TABS: ORAL_TABLET | ORAL | 0 refills | Status: DC

## 2015-11-04 NOTE — Assessment & Plan Note (Signed)
Unclear etiology today. Groin strain vs possible early hernia. Not consistent with epididymitis, prostatitis.  UA today normal. Sent UCx.  Given endorsed dysuria will empirically treat with bactrim ds 5d course.  Will also start short NSAID course.  Update if not better for further evaluation.

## 2015-11-04 NOTE — Progress Notes (Signed)
Pre visit review using our clinic review tool, if applicable. No additional management support is needed unless otherwise documented below in the visit note. 

## 2015-11-04 NOTE — Progress Notes (Addendum)
BP 122/78   Pulse 88   Temp 98.2 F (36.8 C) (Oral)   Wt 217 lb 12 oz (98.8 kg)   BMI 31.24 kg/m    Chief Complaint  Patient presents with  . Groin Pain    right groin/testicle; x1 week; no swelling; no trauma    Subjective:    Patient ID: Shaun Phillips, male    DOB: 11-23-1975, 40 y.o.   MRN: [id number]  HPI: Shaun Phillips is a 40 y.o. male presenting on 11/04/2015 for Groin Pain (right groin/testicle; x1 week; no swelling; no trauma)   1 wk h/o R groin/testicle pain. Denies swelling or trauma or injury. Not lifting weights, no straining recently. Some dysuria as well. Some incomplete emptying. Last week he did have some R flank discomfort that has since resolved.   No fevers/chills, discharge, abd pain, nausea, lower back pain or rectal pain. No blood in urine. No new sexual partners.   H/o scrotal pain after injury 01/2014 - this improved.   Lots of air travel with new job with [employer].   Relevant past medical, surgical, family and social history reviewed and updated as indicated. Interim medical history since our last visit reviewed. Allergies and medications reviewed and updated. Current Outpatient Prescriptions on File Prior to Visit  Medication Sig  . Multiple Vitamins-Minerals (MULTIVITAMIN & MINERAL PO) Take 1 tablet by mouth daily. Reported on 03/19/2015  . omega-3 acid ethyl esters (LOVAZA) 1 G capsule Take 1 g by mouth 2 (two) times daily. Reported on 03/19/2015   No current facility-administered medications on file prior to visit.     Review of Systems Per HPI unless specifically indicated in ROS section     Objective:    BP 122/78   Pulse 88   Temp 98.2 F (36.8 C) (Oral)   Wt 217 lb 12 oz (98.8 kg)   BMI 31.24 kg/m   Wt Readings from Last 3 Encounters:  11/04/15 217 lb 12 oz (98.8 kg)  03/19/15 214 lb 4 oz (97.2 kg)  03/03/15 216 lb 4 oz (98.1 kg)    Physical Exam  Constitutional: He appears well-developed and well-nourished. No distress.    Abdominal: Soft. Normal appearance and bowel sounds are normal. He exhibits no distension and no mass. There is no hepatosplenomegaly. There is no tenderness. There is no rebound, no guarding and no CVA tenderness. Hernia confirmed negative in the right inguinal area and confirmed negative in the left inguinal area.  Genitourinary: Testes normal and penis normal. Right testis shows no mass, no swelling and no tenderness. Right testis is descended. Left testis shows no mass, no swelling and no tenderness. Left testis is descended. No hypospadias or penile tenderness.  Genitourinary Comments: Tender to palpation at right groin just inferior to inguinal canal without apparent hernia or LAD No pain with resistance   Musculoskeletal: He exhibits no edema.  Lymphadenopathy:       Right: No inguinal adenopathy present.       Left: No inguinal adenopathy present.  Neurological:  5/5 strength groin muscles with testing against resistance, no pain  Skin: Skin is warm and dry. No rash noted.  Nursing note and vitals reviewed.  Results for orders placed or performed in visit on 11/04/15  POCT Urinalysis Dipstick (Automated)  Result Value Ref Range   Color, UA Yellow    Clarity, UA Clear    Glucose, UA Negative    Bilirubin, UA Negative    Ketones, UA Negative    Spec  Grav, UA 1.020    Blood, UA Negative    pH, UA 6.0    Protein, UA Negative    Urobilinogen, UA 0.2    Nitrite, UA Negative    Leukocytes, UA Negative Negative      Assessment & Plan:   Problem List Items Addressed This Visit    Groin pain - Primary    Unclear etiology today. Groin strain vs possible early hernia. Not consistent with epididymitis, prostatitis.  UA today normal. Sent UCx.  Given endorsed dysuria will empirically treat with bactrim ds 5d course.  Will also start short NSAID course.  Update if not better for further evaluation.       Relevant Orders   POCT Urinalysis Dipstick (Automated) (Completed)   Urine  culture    Other Visit Diagnoses   None.      Follow up plan: No Follow-up on file.  Eustaquio Boyden, MD

## 2015-11-04 NOTE — Patient Instructions (Addendum)
Urine looking ok today. Regardless we will send urine culture. Start bactrim antibiotic twice daily for 5 days and anti inflammatory twice daily for 1 week with food then as needed. Update after 2 wks if not improved with treatment.

## 2015-11-06 LAB — URINE CULTURE: ORGANISM ID, BACTERIA: NO GROWTH

## 2015-11-21 ENCOUNTER — Encounter: Payer: Self-pay | Admitting: Family Medicine

## 2016-02-17 IMAGING — US US SCROTUM W/ DOPPLER COMPLETE
1 series · 14 of 25 positions shown · non-contrast
Comparison: None.

CLINICAL DATA: Left-sided testicular pain since [REDACTED] following
trauma ; slight swelling and discoloration of the left aspect of the
scrotum ; initial visit

EXAM:
SCROTAL ULTRASOUND
DOPPLER ULTRASOUND OF THE TESTICLES
TECHNIQUE: Complete ultrasound examination of the testicles, epididymis, and
other scrotal structures was performed. Color and spectral Doppler
ultrasound were also utilized to evaluate blood flow to the
testicles.

[Series 1: us scrotum w/ doppler complete · 0.08mm/px · 14 of 73 slices shown]
[im 1/73]
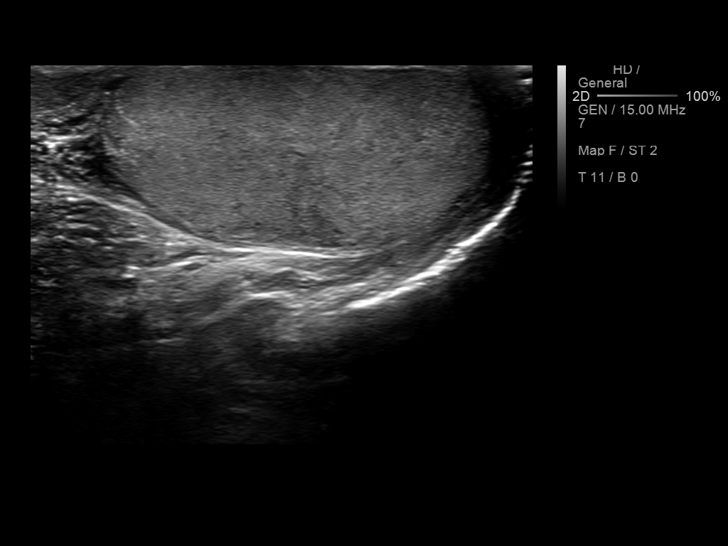
[im 7/73]
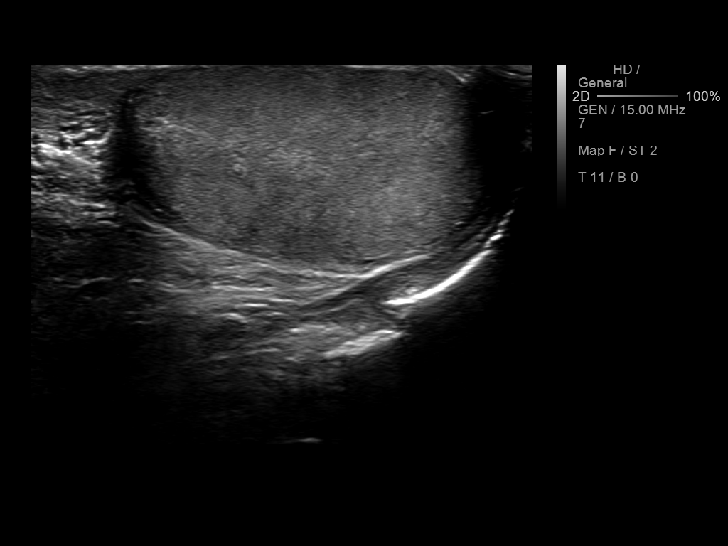
[im 13/73]
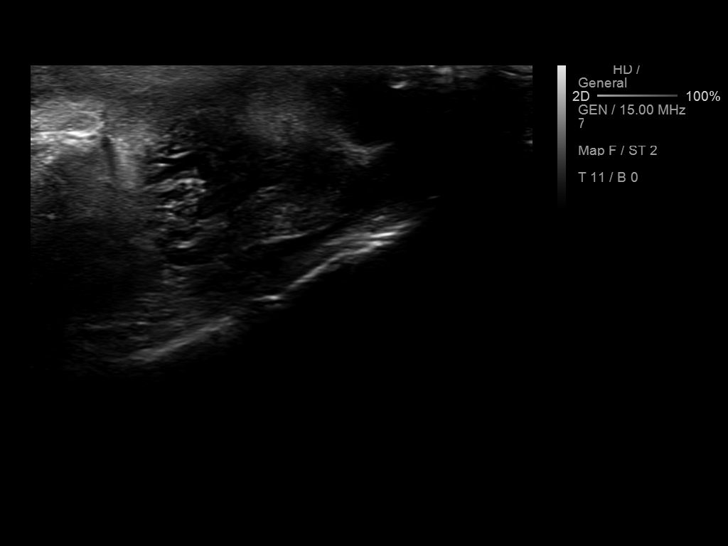
[im 19/73]
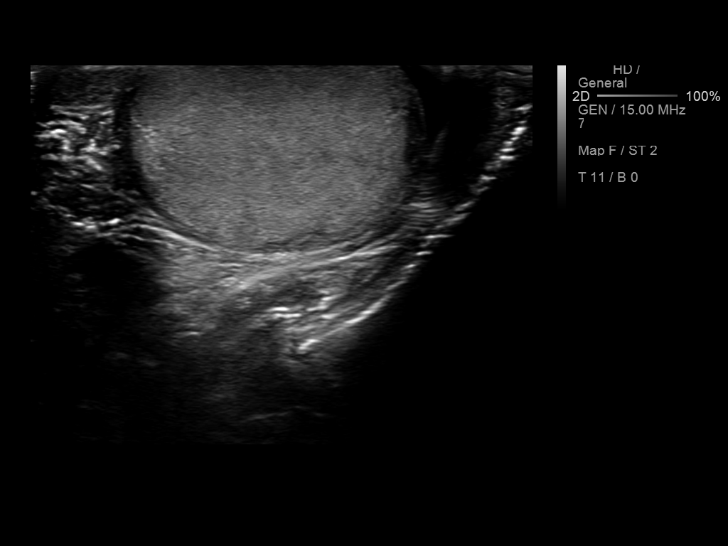
[im 25/73]
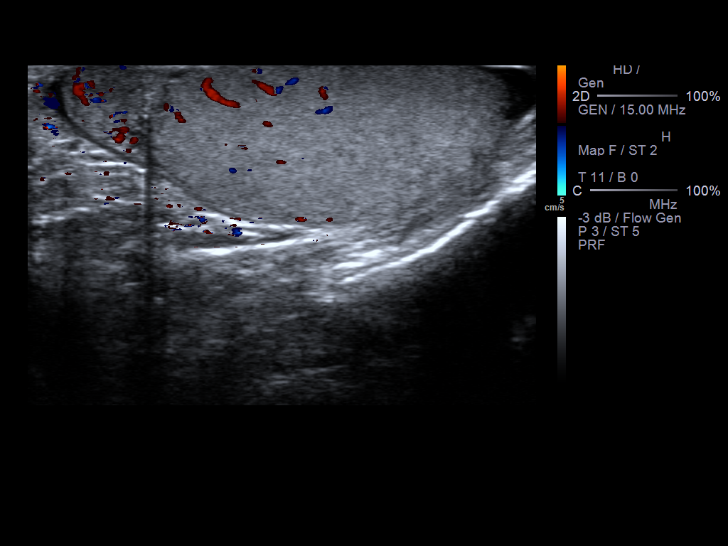
[im 28/73]
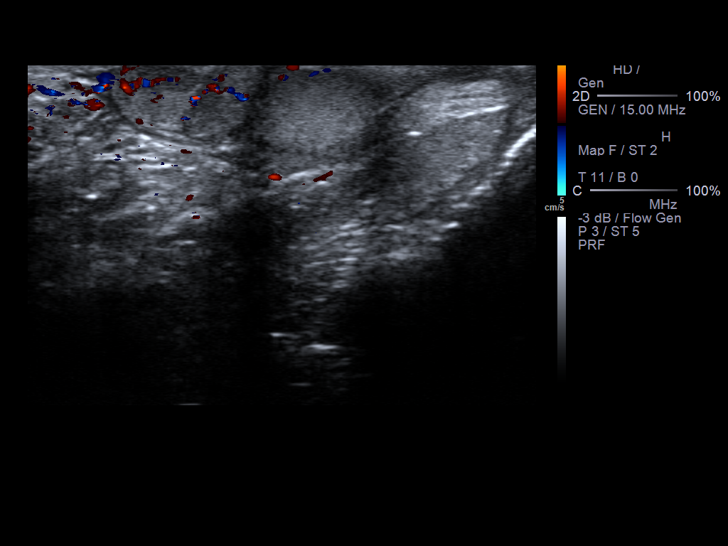
[im 34/73]
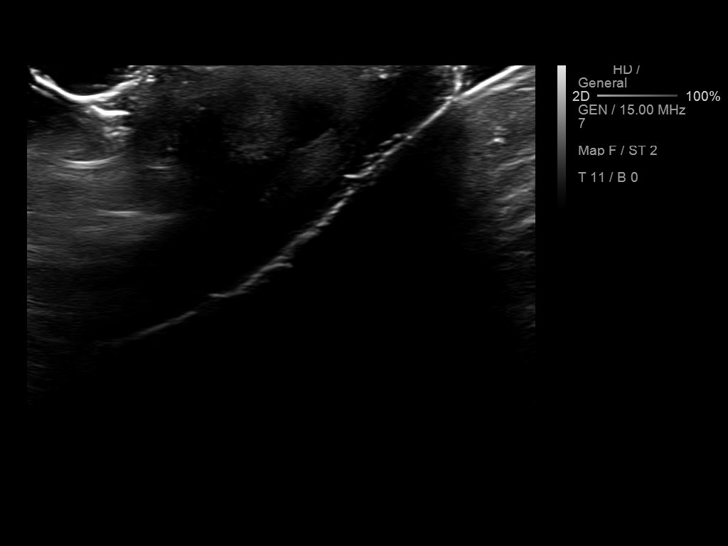
[im 40/73]
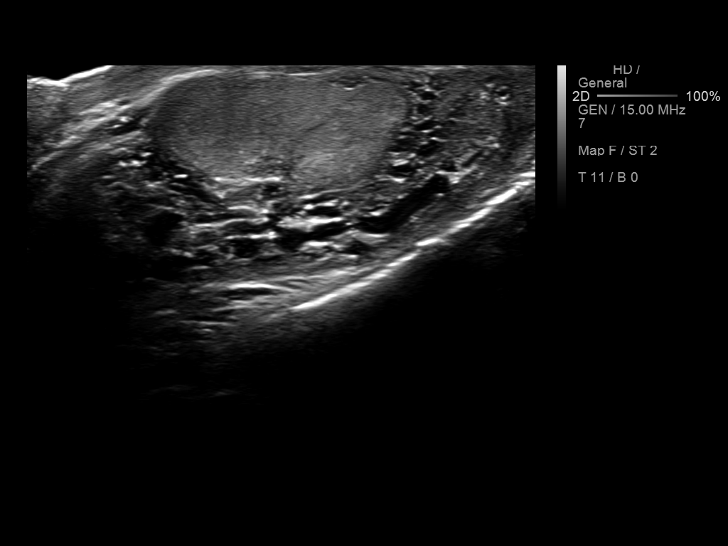
[im 46/73]
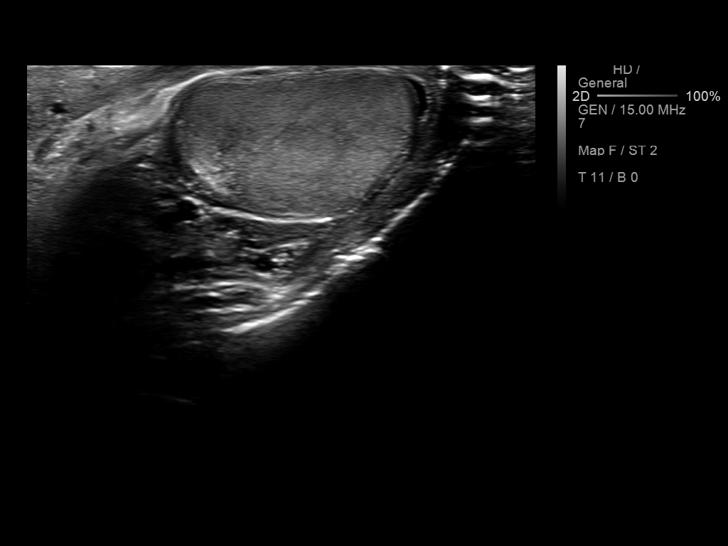
[im 49/73]
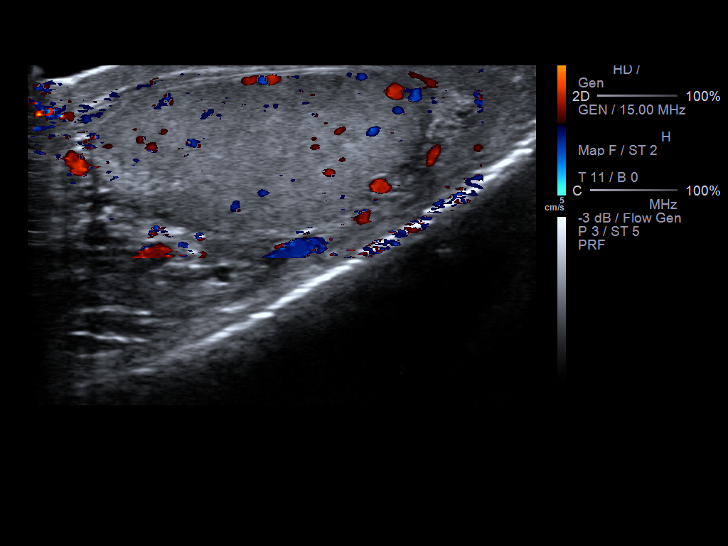
[im 55/73]
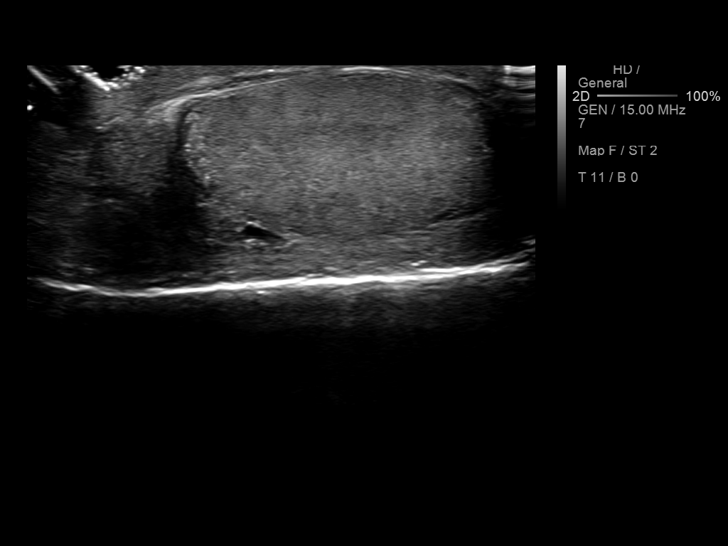
[im 61/73]
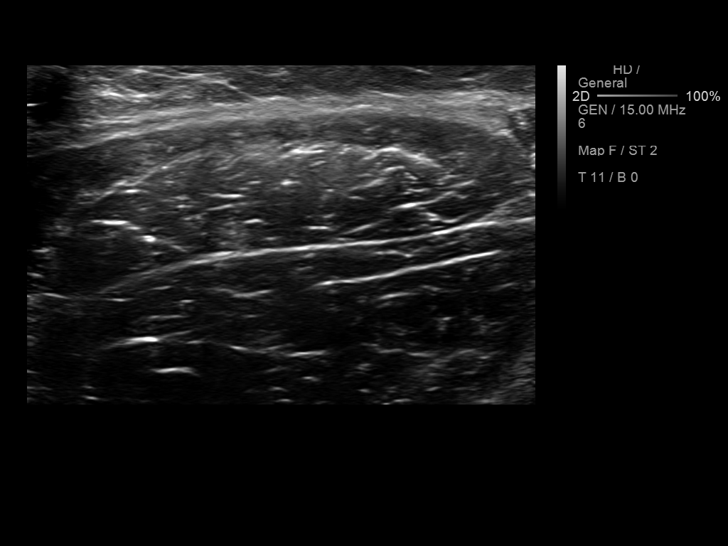
[im 67/73]
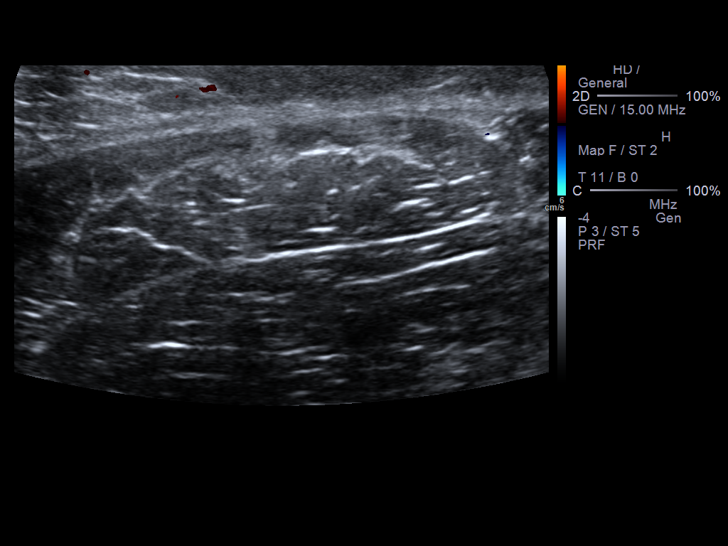
[im 73/73]
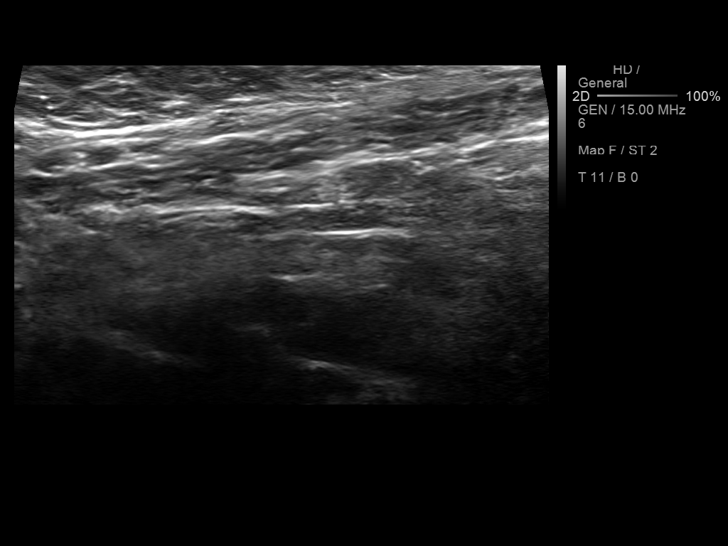

[14 of 25 positions shown; findings below may reference images not displayed]

FINDINGS: Right testicle

Measurements: 4.3 x 2.0 x 2.9 cm. No mass or microlithiasis
visualized.

Left testicle

Measurements: 3.6 x 1.7 x 2.7 cm. No mass or microlithiasis
visualized.

Right epididymis:  Normal in size and appearance.

Left epididymis:  Normal in size and appearance.

Hydrocele:  None visualized.

Varicocele:  None visualized.

Pulsed Doppler interrogation of both testes demonstrates low
resistance arterial and venous waveforms bilaterally.
IMPRESSION: There is no evidence of testicular torsion or testicular fracture.
No acute inflammatory changes are demonstrated. There is no
hydrocele.

## 2016-03-13 ENCOUNTER — Other Ambulatory Visit: Payer: Self-pay | Admitting: Family Medicine

## 2016-03-13 DIAGNOSIS — E785 Hyperlipidemia, unspecified: Secondary | ICD-10-CM

## 2016-03-14 ENCOUNTER — Other Ambulatory Visit (INDEPENDENT_AMBULATORY_CARE_PROVIDER_SITE_OTHER): Payer: 59

## 2016-03-14 DIAGNOSIS — E785 Hyperlipidemia, unspecified: Secondary | ICD-10-CM | POA: Diagnosis not present

## 2016-03-14 LAB — COMPREHENSIVE METABOLIC PANEL
ALT: 22 U/L (ref 0–53)
AST: 16 U/L (ref 0–37)
Albumin: 4.9 g/dL (ref 3.5–5.2)
Alkaline Phosphatase: 47 U/L (ref 39–117)
BUN: 17 mg/dL (ref 6–23)
CALCIUM: 9.9 mg/dL (ref 8.4–10.5)
CHLORIDE: 100 meq/L (ref 96–112)
CO2: 29 meq/L (ref 19–32)
Creatinine, Ser: 1.09 mg/dL (ref 0.40–1.50)
GFR: 79.65 mL/min (ref >60.00)
GLUCOSE: 104 mg/dL — AB (ref 70–99)
POTASSIUM: 4.3 meq/L (ref 3.5–5.1)
Sodium: 138 mEq/L (ref 135–145)
Total Bilirubin: 1 mg/dL (ref 0.2–1.2)
Total Protein: 8.1 g/dL (ref 6.0–8.3)

## 2016-03-14 LAB — LIPID PANEL
CHOL/HDL RATIO: 6
CHOLESTEROL: 215 mg/dL — AB (ref 0–200)
HDL: 37.7 mg/dL — ABNORMAL LOW (ref >39.00)
NONHDL: 177.16
TRIGLYCERIDES: 283 mg/dL — AB (ref 0.0–149.0)
VLDL: 56.6 mg/dL — AB (ref 0.0–40.0)

## 2016-03-14 LAB — LDL CHOLESTEROL, DIRECT: LDL DIRECT: 111 mg/dL

## 2016-03-14 LAB — TSH: TSH: 2.03 u[IU]/mL (ref 0.35–4.50)

## 2016-03-21 ENCOUNTER — Ambulatory Visit (INDEPENDENT_AMBULATORY_CARE_PROVIDER_SITE_OTHER): Payer: 59 | Admitting: Family Medicine

## 2016-03-21 ENCOUNTER — Encounter: Payer: Self-pay | Admitting: Family Medicine

## 2016-03-21 VITALS — BP 120/80 | HR 81 | Ht 70.0 in | Wt 215.0 lb

## 2016-03-21 DIAGNOSIS — E785 Hyperlipidemia, unspecified: Secondary | ICD-10-CM | POA: Diagnosis not present

## 2016-03-21 DIAGNOSIS — Z0000 Encounter for general adult medical examination without abnormal findings: Secondary | ICD-10-CM

## 2016-03-21 NOTE — Assessment & Plan Note (Signed)
Preventative protocols reviewed and updated unless pt declined. Discussed healthy diet and lifestyle.  

## 2016-03-21 NOTE — Progress Notes (Signed)
BP 120/80   Pulse 81   Ht 5\' 10"  (1.778 m)   Wt 215 lb (97.5 kg)   SpO2 95%   BMI 30.85 kg/m    CC: CPE Subjective:    Patient ID: Shaun Phillips, male    DOB: 02-09-1976, 40 y.o.   MRN: [id number]  HPI: Shaun Phillips is a 40 y.o. male presenting on 03/21/2016 for Annual Exam   Preventative: Td 2009 Flu shot - yearly Seat belt use disucssed Sunscreen use discussed. No changing moles on skin. Sees derm yearly Non smoker Alcohol - weekends  Lives with wife and 2 sons, 1 dog Occ: Left GE, now working for [employer] sales Activity: no regular exercise regimen Diet: good water, fruits/vegetables daily   Relevant past medical, surgical, family and social history reviewed and updated as indicated. Interim medical history since our last visit reviewed. Allergies and medications reviewed and updated. Current Outpatient Prescriptions on File Prior to Visit  Medication Sig  . Multiple Vitamins-Minerals (MULTIVITAMIN & MINERAL PO) Take 1 tablet by mouth daily. Reported on 03/19/2015  . naproxen (NAPROSYN) 500 MG tablet Take one po bid x 1 week then prn pain, take with food (Patient not taking: Reported on 03/21/2016)   No current facility-administered medications on file prior to visit.     Review of Systems  Constitutional: Negative for activity change, appetite change, chills, fatigue, fever and unexpected weight change.  HENT: Positive for congestion. Negative for hearing loss.   Eyes: Negative for visual disturbance.  Respiratory: Negative for cough (recent URI), chest tightness, shortness of breath and wheezing.   Cardiovascular: Negative for chest pain, palpitations and leg swelling.  Gastrointestinal: Negative for abdominal distention, abdominal pain, blood in stool, constipation, diarrhea, nausea and vomiting.  Genitourinary: Negative for difficulty urinating and hematuria.  Musculoskeletal: Negative for arthralgias, myalgias and neck pain.  Skin: Negative  for rash.  Neurological: Negative for dizziness, seizures, syncope and headaches.  Hematological: Negative for adenopathy. Does not bruise/bleed easily.  Psychiatric/Behavioral: Negative for dysphoric mood. The patient is not nervous/anxious.    Per HPI unless specifically indicated in ROS section     Objective:    BP 120/80   Pulse 81   Ht 5\' 10"  (1.778 m)   Wt 215 lb (97.5 kg)   SpO2 95%   BMI 30.85 kg/m   Wt Readings from Last 3 Encounters:  03/21/16 215 lb (97.5 kg)  11/04/15 217 lb 12 oz (98.8 kg)  03/19/15 214 lb 4 oz (97.2 kg)    Physical Exam  Constitutional: He is oriented to person, place, and time. He appears well-developed and well-nourished. No distress.  HENT:  Head: Normocephalic and atraumatic.  Right Ear: Hearing, tympanic membrane, external ear and ear canal normal.  Left Ear: Hearing, tympanic membrane, external ear and ear canal normal.  Nose: Nose normal.  Mouth/Throat: Uvula is midline, oropharynx is clear and moist and mucous membranes are normal. No oropharyngeal exudate, posterior oropharyngeal edema or posterior oropharyngeal erythema.  Eyes: Conjunctivae and EOM are normal. Pupils are equal, round, and reactive to light. No scleral icterus.  Neck: Normal range of motion. Neck supple. No thyromegaly present.  Cardiovascular: Normal rate, regular rhythm, normal heart sounds and intact distal pulses.   No murmur heard. Pulses:      Radial pulses are 2+ on the right side, and 2+ on the left side.  Pulmonary/Chest: Effort normal and breath sounds normal. No respiratory distress. He has no wheezes. He has no rales.  Abdominal: Soft. Bowel sounds are normal. He exhibits no distension and no mass. There is no tenderness. There is no rebound and no guarding.  Musculoskeletal: Normal range of motion. He exhibits no edema.  Lymphadenopathy:    He has no cervical adenopathy.  Neurological: He is alert and oriented to person, place, and time.  CN grossly  intact, station and gait intact  Skin: Skin is warm and dry. No rash noted.  Psychiatric: He has a normal mood and affect. His behavior is normal. Judgment and thought content normal.  Nursing note and vitals reviewed.  Results for orders placed or performed in visit on 03/14/16  Lipid panel  Result Value Ref Range   Cholesterol 215 (H) 0 - 200 mg/dL   Triglycerides 161.0283.0 (H) 0.0 - 149.0 mg/dL   HDL 96.0437.70 (L) >54.09>39.00 mg/dL   VLDL 81.156.6 (H) 0.0 - 91.440.0 mg/dL   Total CHOL/HDL Ratio 6    NonHDL 177.16   Comprehensive metabolic panel  Result Value Ref Range   Sodium 138 135 - 145 mEq/L   Potassium 4.3 3.5 - 5.1 mEq/L   Chloride 100 96 - 112 mEq/L   CO2 29 19 - 32 mEq/L   Glucose, Bld 104 (H) 70 - 99 mg/dL   BUN 17 6 - 23 mg/dL   Creatinine, Ser 7.821.09 0.40 - 1.50 mg/dL   Total Bilirubin 1.0 0.2 - 1.2 mg/dL   Alkaline Phosphatase 47 39 - 117 U/L   AST 16 0 - 37 U/L   ALT 22 0 - 53 U/L   Total Protein 8.1 6.0 - 8.3 g/dL   Albumin 4.9 3.5 - 5.2 g/dL   Calcium 9.9 8.4 - 95.610.5 mg/dL   GFR 21.3079.65 >86.57>60.00 mL/min  TSH  Result Value Ref Range   TSH 2.03 0.35 - 4.50 uIU/mL  LDL cholesterol, direct  Result Value Ref Range   Direct LDL 111.0 mg/dL      Assessment & Plan:   Problem List Items Addressed This Visit    Dyslipidemia    Chronic, reviewed with patient as well as healthy diet and lifestyle changes to help control triglycerides/HDL.       Routine general medical examination at a health care facility - Primary    Preventative protocols reviewed and updated unless pt declined. Discussed healthy diet and lifestyle.           Follow up plan: Return in about 1 year (around 03/21/2017) for annual exam, prior fasting for blood work.  Eustaquio BoydenJavier Clytee Heinrich, MD

## 2016-03-21 NOTE — Assessment & Plan Note (Signed)
Chronic, reviewed with patient as well as healthy diet and lifestyle changes to help control triglycerides/HDL.

## 2016-03-21 NOTE — Patient Instructions (Addendum)
Work on [employer] diet and lifestyle changes to improve cholesterol levels  Return in 6 months for repeat labs only.  Return in 1 year for physical. Happy birthday!  Health Maintenance, Male A healthy lifestyle and preventative care can promote health and wellness.  Maintain regular health, dental, and eye exams.  Eat a healthy diet. Foods like vegetables, fruits, whole grains, low-fat dairy products, and lean protein foods contain the nutrients you need and are low in calories. Decrease your intake of foods high in solid fats, added sugars, and salt. Get information about a proper diet from your health care provider, if necessary.  Regular physical exercise is one of the most important things you can do for your health. Most adults should get at least 150 minutes of moderate-intensity exercise (any activity that increases your heart rate and causes you to sweat) each week. In addition, most adults need muscle-strengthening exercises on 2 or more days a week.   Maintain a healthy weight. The body mass index (BMI) is a screening tool to identify possible weight problems. It provides an estimate of body fat based on height and weight. Your health care provider can find your BMI and can help you achieve or maintain a healthy weight. For males 20 years and older:  A BMI below 18.5 is considered underweight.  A BMI of 18.5 to 24.9 is normal.  A BMI of 25 to 29.9 is considered overweight.  A BMI of 30 and above is considered obese.  Maintain normal blood lipids and cholesterol by exercising and minimizing your intake of saturated fat. Eat a balanced diet with plenty of fruits and vegetables. Blood tests for lipids and cholesterol should begin at age 40 and be repeated every 5 years. If your lipid or cholesterol levels are high, you are over age 40, or you are at high risk for heart disease, you may need your cholesterol levels checked more frequently.Ongoing high lipid and cholesterol levels should  be treated with medicines if diet and exercise are not working.  If you smoke, find out from your health care provider how to quit. If you do not use tobacco, do not start.  Lung cancer screening is recommended for adults aged 55-80 years who are at high risk for developing lung cancer because of a history of smoking. A yearly low-dose CT scan of the lungs is recommended for people who have at least a 30-pack-year history of smoking and are current smokers or have quit within the past 15 years. A pack year of smoking is smoking an average of 1 pack of cigarettes a day for 1 year (for example, a 30-pack-year history of smoking could mean smoking 1 pack a day for 30 years or 2 packs a day for 15 years). Yearly screening should continue until the smoker has stopped smoking for at least 15 years. Yearly screening should be stopped for people who develop a health problem that would prevent them from having lung cancer treatment.  If you choose to drink alcohol, do not have more than 2 drinks per day. One drink is considered to be 12 oz (360 mL) of beer, 5 oz (150 mL) of wine, or 1.5 oz (45 mL) of liquor.  Avoid the use of street drugs. Do not share needles with anyone. Ask for help if you need support or instructions about stopping the use of drugs.  High blood pressure causes heart disease and increases the risk of stroke. High blood pressure is more likely to develop in:  People who have blood pressure in the end of the normal range (100-139/85-89 mm Hg).  People who are overweight or obese.  People who are African American.  If you are 5918-40 years of age, have your blood pressure checked every 3-5 years. If you are 40 years of age or older, have your blood pressure checked every year. You should have your blood pressure measured twice-once when you are at a hospital or clinic, and once when you are not at a hospital or clinic. Record the average of the two measurements. To check your blood pressure  when you are not at a hospital or clinic, you can use:  An automated blood pressure machine at a pharmacy.  A home blood pressure monitor.  If you are 3145-40 years old, ask your health care provider if you should take aspirin to prevent heart disease.  Diabetes screening involves taking a blood sample to check your fasting blood sugar level. This should be done once every 3 years after age 40 if you are at a normal weight and without risk factors for diabetes. Testing should be considered at a younger age or be carried out more frequently if you are overweight and have at least 1 risk factor for diabetes.  Colorectal cancer can be detected and often prevented. Most routine colorectal cancer screening begins at the age of 40 and continues through age 40. However, your health care provider may recommend screening at an earlier age if you have risk factors for colon cancer. On a yearly basis, your health care provider may provide home test kits to check for hidden blood in the stool. A small camera at the end of a tube may be used to directly examine the colon (sigmoidoscopy or colonoscopy) to detect the earliest forms of colorectal cancer. Talk to your health care provider about this at age 40 when routine screening begins. A direct exam of the colon should be repeated every 5-10 years through age 40, unless early forms of precancerous polyps or small growths are found.  People who are at an increased risk for hepatitis B should be screened for this virus. You are considered at high risk for hepatitis B if:  You were born in a country where hepatitis B occurs often. Talk with your health care provider about which countries are considered high risk.  Your parents were born in a high-risk country and you have not received a shot to protect against hepatitis B (hepatitis B vaccine).  You have HIV or AIDS.  You use needles to inject street drugs.  You live with, or have sex with, someone who has  hepatitis B.  You are a man who has sex with other men (MSM).  You get hemodialysis treatment.  You take certain medicines for conditions like cancer, organ transplantation, and autoimmune conditions.  Hepatitis C blood testing is recommended for all people born from [birth year] through 1965 and any individual with known risk factors for hepatitis C.  Healthy men should no longer receive prostate-specific antigen (PSA) blood tests as part of routine cancer screening. Talk to your health care provider about prostate cancer screening.  Testicular cancer screening is not recommended for adolescents or adult males who have no symptoms. Screening includes self-exam, a health care provider exam, and other screening tests. Consult with your health care provider about any symptoms you have or any concerns you have about testicular cancer.  Practice safe sex. Use condoms and avoid high-risk sexual practices to reduce the spread of  sexually transmitted infections (STIs).  You should be screened for STIs, including gonorrhea and chlamydia if:  You are sexually active and are younger than 24 years.  You are older than 24 years, and your health care provider tells you that you are at risk for this type of infection.  Your sexual activity has changed since you were last screened, and you are at an increased risk for chlamydia or gonorrhea. Ask your health care provider if you are at risk.  If you are at risk of being infected with HIV, it is recommended that you take a prescription medicine daily to prevent HIV infection. This is called pre-exposure prophylaxis (PrEP). You are considered at risk if:  You are a man who has sex with other men (MSM).  You are a heterosexual man who is sexually active with multiple partners.  You take drugs by injection.  You are sexually active with a partner who has HIV.  Talk with your health care provider about whether you are at high risk of being infected with HIV. If  you choose to begin PrEP, you should first be tested for HIV. You should then be tested every 3 months for as long as you are taking PrEP.  Use sunscreen. Apply sunscreen liberally and repeatedly throughout the day. You should seek shade when your shadow is shorter than you. Protect yourself by wearing long sleeves, pants, a wide-brimmed hat, and sunglasses year round whenever you are outdoors.  Tell your health care provider of new moles or changes in moles, especially if there is a change in shape or color. Also, tell your health care provider if a mole is larger than the size of a pencil eraser.  A one-time screening for abdominal aortic aneurysm (AAA) and surgical repair of large AAAs by ultrasound is recommended for men aged 65-75 years who are current or former smokers.  Stay current with your vaccines (immunizations). This information is not intended to replace advice given to you by your health care provider. Make sure you discuss any questions you have with your health care provider. Document Released: 09/17/2007 Document Revised: 04/11/2014 Document Reviewed: 12/23/2014 Elsevier Interactive Patient Education  2017 [employer].

## 2016-09-18 ENCOUNTER — Other Ambulatory Visit: Payer: Self-pay | Admitting: Family Medicine

## 2016-09-18 DIAGNOSIS — E785 Hyperlipidemia, unspecified: Secondary | ICD-10-CM

## 2016-09-19 ENCOUNTER — Other Ambulatory Visit (INDEPENDENT_AMBULATORY_CARE_PROVIDER_SITE_OTHER): Payer: Commercial Managed Care - PPO

## 2016-09-19 DIAGNOSIS — E785 Hyperlipidemia, unspecified: Secondary | ICD-10-CM | POA: Diagnosis not present

## 2016-09-19 LAB — LDL CHOLESTEROL, DIRECT: LDL DIRECT: 104 mg/dL

## 2016-09-19 LAB — BASIC METABOLIC PANEL
BUN: 20 mg/dL (ref 6–23)
CALCIUM: 9.5 mg/dL (ref 8.4–10.5)
CO2: 30 meq/L (ref 19–32)
Chloride: 102 mEq/L (ref 96–112)
Creatinine, Ser: 1.08 mg/dL (ref 0.40–1.50)
GFR: 80.29 mL/min (ref >60.00)
Glucose, Bld: 95 mg/dL (ref 70–99)
POTASSIUM: 4.2 meq/L (ref 3.5–5.1)
SODIUM: 138 meq/L (ref 135–145)

## 2016-09-19 LAB — LIPID PANEL
Cholesterol: 198 mg/dL (ref 0–200)
HDL: 40 mg/dL (ref >39.00)
NONHDL: 158.19
Total CHOL/HDL Ratio: 5
Triglycerides: 239 mg/dL — ABNORMAL HIGH (ref 0.0–149.0)
VLDL: 47.8 mg/dL — AB (ref 0.0–40.0)

## 2017-02-06 ENCOUNTER — Telehealth: Payer: Self-pay | Admitting: *Deleted

## 2017-02-06 NOTE — Telephone Encounter (Deleted)
Copied from CRM #[id number]. Topic: Medical Record Request - Provider/Facility Request >> Jan 30, 2017  9:37 AM Landry MellowFoltz, Melissa J wrote: Patient Name/DOB/MRN #: Shaun Phillips [birth year] Requestor Name/Agency: [facility] Call Back #: [phone] Requested: checking status on med records request    [facility]. For all other clinics, route to the clinic's PEC Pool. >> Jan 30, 2017  4:04 PM Junious DresserSmith, Janet B wrote: Call back number incomplete. routing back to CRM. >> Jan 30, 2017  5:17 PM Laws, Heber Carolinaegina C, LPN wrote: [facility]. [facility] and that is where it has been sent.

## 2017-02-06 NOTE — Telephone Encounter (Signed)
Encounter opened in error

## 2017-02-27 ENCOUNTER — Ambulatory Visit: Payer: Self-pay | Admitting: *Deleted

## 2017-02-27 DIAGNOSIS — B001 Herpesviral vesicular dermatitis: Secondary | ICD-10-CM | POA: Insufficient documentation

## 2017-02-27 MED ORDER — VALACYCLOVIR HCL 1 G PO TABS: 2000.0000 mg | ORAL_TABLET | Freq: Two times a day (BID) | ORAL | 0 refills | Status: DC

## 2017-02-27 NOTE — Telephone Encounter (Signed)
Spoke with pt relaying message per Dr. G.  Pt says ok. 

## 2017-02-27 NOTE — Telephone Encounter (Signed)
Patient has appointment on Wednesday- going out of town- can something be called in. That appointment can be cancelled. Please call patient if Rx can be called.  Reason for Disposition . 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 LOCATION: "Where is the ulcer located?"      mouth 2. NUMBER: "How many ulcers are there?"      Outside and corner of mouth 3. SIZE: "How large is the ulcer?"      Dime size 4. SEVERITY: "Are they painful?" If so, ask: "How bad is it?"  (Scale 1-10; or mild, moderate, severe)  - MILD - eating  and drinking normally   - MODERATE - decreased liquid intake   - SEVERE - drinking very little      If eats the wrong thing 5. ONSET: "When did you first notice the ulcer?"      4 days ago 6. RECURRENT SYMPTOM: "Have you had a mouth ulcer before?" If so, ask: "When was the last time?" and "What happened that time?"      Yes-  1 month ago- stress causes 7. CAUSE: "What do you think is causing the mouth ulcer?"     stress 8. OTHER SYMPTOMS: "Do you have any other symptoms?" (e.g., fever)     no 9. PREGNANCY: "Is there any chance you are pregnant?" "When was your last menstrual period?"     n/a  Answer Assessment - Initial Assessment Questions 1. APPEARANCE of BLISTERS: "Describe the sores."     Blisters on lips 2. SIZE: "How large an area is involved with the cold sores?" (e.g., inches, cm or compare to coins)     Dime size 3. LOCATION: "Which part of the lip is involved?"     Lip and corner of mouth 4. ONSET: "When did the fever blisters begin?"     4 days ago 5. RECURRENT BLISTERS: "Have you had fever blisters before?" If so, ask: "When was the last time?" "How many times a year?"     Yes- 1 month ago- normally uses OTC- but would like Rx 6. OTHER SYMPTOMS: "Do you have any other symptoms?" (e.g., fever, sores inside mouth)  Sometimes gets sores inside mouth 7. PREGNANCY: "Is there any chance you are pregnant?" "When was your last menstrual period?"     n/a  Protocols used: COLD SORES (FEVER BLISTERS)-A-AH, MOUTH ULCERS-A-AH

## 2017-02-27 NOTE — Telephone Encounter (Signed)
May try valtrex - sent to pharmacy. Come in if not improving. Otherwise may cancel appt Wed.

## 2017-03-01 ENCOUNTER — Ambulatory Visit: Payer: Commercial Managed Care - PPO | Admitting: Family Medicine

## 2017-08-03 ENCOUNTER — Ambulatory Visit: Payer: Commercial Managed Care - PPO | Admitting: Primary Care

## 2017-08-15 ENCOUNTER — Other Ambulatory Visit: Payer: Self-pay | Admitting: Family Medicine

## 2017-08-15 NOTE — Telephone Encounter (Signed)
Copied from CRM [id number]. Topic: Quick Communication - Rx Refill/Question >> Aug 15, 2017 11:55 AM Landry Mellow wrote: Medication: valACYclovir (VALTREX) 1000 MG tablet Has the patient contacted their pharmacy? No. Expired - pt was driving  (Agent: If no, request that the patient contact the pharmacy for the refill.) Preferred Pharmacy (with phone number or street name): [facility]  Agent: Please be advised that RX refills may take up to 3 business days. We ask that you follow-up with your pharmacy. Pt is traveling and can not make appt until Friday

## 2017-08-15 NOTE — Telephone Encounter (Signed)
Refill request for Valtrex. Pt states he is travelling and can not make appt until Friday.  LOV: 03/21/16 Dr. Sharen Hones  [facility] [address]

## 2017-08-18 MED ORDER — VALACYCLOVIR HCL 1 G PO TABS: 2000.0000 mg | ORAL_TABLET | Freq: Two times a day (BID) | ORAL | 0 refills | Status: DC

## 2019-01-03 DIAGNOSIS — U071 COVID-19: Secondary | ICD-10-CM

## 2019-01-03 HISTORY — DX: COVID-19: U07.1

## 2019-01-09 ENCOUNTER — Other Ambulatory Visit: Payer: Self-pay

## 2019-01-09 DIAGNOSIS — Z20822 Contact with and (suspected) exposure to covid-19: Secondary | ICD-10-CM

## 2019-01-10 ENCOUNTER — Telehealth: Payer: Self-pay | Admitting: Family Medicine

## 2019-01-10 LAB — NOVEL CORONAVIRUS, NAA: SARS-CoV-2, NAA: DETECTED — AB

## 2019-01-10 NOTE — Telephone Encounter (Signed)
Best number [phone]  Pt called stating he tested positive for covid results today 10/8  He has had low grade fever chills headache body aches for past 3 days  No sob at this time  He son test positive 10/1  No symptoms  Are there any  Recommendations  over counter meds to help fight covid or symptoms  Pt has been taking below meds since son results came back    Airborne Allergia  Mult vit Flonase nasal decongestant once daily

## 2019-01-11 NOTE — Telephone Encounter (Signed)
Spoke to pt and he is aware as instructed and expressed understanding

## 2019-01-11 NOTE — Telephone Encounter (Signed)
Continue current treatment - tylenol for body aches, lots of fluid and rest.   plz place on covid + call list to check on him on Monday.

## 2019-01-18 NOTE — Telephone Encounter (Addendum)
Pt returning call.  Says he is feeling much better.  Finished all meds.  Says he has occasional SOB but mild.  Confirms he received vm. [See Labs, 01/09/19.]

## 2019-01-18 NOTE — Telephone Encounter (Signed)
Left message on vm per dpr asking pt to call back with an update on sxs.

## 2019-01-18 NOTE — Telephone Encounter (Signed)
Glad to hear.  Thank you 

## 2019-01-30 ENCOUNTER — Encounter: Payer: Self-pay | Admitting: Family Medicine

## 2019-01-30 NOTE — Telephone Encounter (Signed)
Spoke with pt for an on COVID sxs.  States he is good and was released by the health dept 10/17 or 10/18.  Pt denies any sxs at this time.

## 2021-01-12 ENCOUNTER — Other Ambulatory Visit: Payer: Commercial Managed Care - PPO

## 2021-02-06 ENCOUNTER — Other Ambulatory Visit: Payer: Self-pay | Admitting: Family Medicine

## 2021-02-06 DIAGNOSIS — Z1159 Encounter for screening for other viral diseases: Secondary | ICD-10-CM

## 2021-02-06 DIAGNOSIS — E785 Hyperlipidemia, unspecified: Secondary | ICD-10-CM

## 2021-02-12 ENCOUNTER — Other Ambulatory Visit: Payer: Commercial Managed Care - PPO

## 2021-02-19 ENCOUNTER — Encounter: Payer: Commercial Managed Care - PPO | Admitting: Family Medicine

## 2021-04-06 ENCOUNTER — Telehealth: Payer: Commercial Managed Care - PPO | Admitting: Nurse Practitioner

## 2021-04-06 ENCOUNTER — Encounter: Payer: Self-pay | Admitting: Family Medicine

## 2021-04-06 DIAGNOSIS — R1011 Right upper quadrant pain: Secondary | ICD-10-CM | POA: Diagnosis not present

## 2021-04-06 NOTE — Progress Notes (Signed)
Virtual Visit Consent   Shaun Phillips, you are scheduled for a virtual visit with a [facility] provider today.     Just as with appointments in the office, your consent must be obtained to participate.  Your consent will be active for this visit and any virtual visit you may have with one of our providers in the next 365 days.     If you have a MyChart account, a copy of this consent can be sent to you electronically.  All virtual visits are billed to your insurance company just like a traditional visit in the office.    As this is a virtual visit, video technology does not allow for your provider to perform a traditional examination.  This may limit your provider's ability to fully assess your condition.  If your provider identifies any concerns that need to be evaluated in person or the need to arrange testing (such as labs, EKG, etc.), we will make arrangements to do so.     Although advances in technology are sophisticated, we cannot ensure that it will always work on either your end or our end.  If the connection with a video visit is poor, the visit may have to be switched to a telephone visit.  With either a video or telephone visit, we are not always able to ensure that we have a secure connection.     I need to obtain your verbal consent now.   Are you willing to proceed with your visit today?    Shaun Phillips has provided verbal consent on 04/06/2021 for a virtual visit (video or telephone).   Apolonio Schneiders, FNP   Date: 04/06/2021 7:49 AM   Virtual Visit via Video Note   I, Apolonio Schneiders, connected with  Shaun Phillips  ([id number], June 10, 1975) on 04/06/21 at  8:00 AM EST by a video-enabled telemedicine application and verified that I am speaking with the correct person using two identifiers.  Location: Patient: Virtual Visit Location Patient: Home Provider: Virtual Visit Location Provider: Home Office   I discussed the limitations of evaluation and management by telemedicine and the  availability of in person appointments. The patient expressed understanding and agreed to proceed.    History of Present Illness: Shaun Phillips is a 46 y.o. who identifies as a male who was assigned male at birth, and is being seen today with ongoing abdominal pain.  The pain started when he laid down three days ago. The pain was isolated to right side of umbilical area initially.   He did workup that morning - lower body workout no pain with exercise or immediately after.   Next morning he was uncomfortable and had some bloating.  Was able to exercise the next day. After exercising the pain worsened. He went to UC & was diagnosed with hernia. He was treated with a muscle relaxer that he has not started.   Over the past two days he has felt more bloated and has had a "sour" stomach.   He has been trying to increase fiber, drinking smoothies and also started a probiotic.   He has had normal bowel movements.   A similar episode happened a few years ago and he went to his PCP- appears to have been viral diarrheal episode also resolved with probiotics.   Patient notes he has been "off diet" for the past weeks over the holidays. Has been eating more cheese and fatty foods.   Denies any fever or systemic symptoms.   Problems:  Patient  Active Problem List   Diagnosis Date Noted   Fever blister 02/27/2017   Groin pain 11/04/2015   Near syncope 12/29/2014   Left leg paresthesias 04/21/2014   Dyslipidemia 04/21/2014   Scrotal pain 01/10/2014   Routine general medical examination at a health care facility 04/25/2012   Anxiety 11/02/2007   ALLERGIC RHINITIS 11/02/2007   GERD 11/02/2007    Allergies:  Allergies  Allergen Reactions   Ciprofloxacin     REACTION: ANAPHYLACTIC LIKE REACTION   Medications:  Current Outpatient Medications:    Multiple Vitamins-Minerals (MULTIVITAMIN & MINERAL PO), Take 1 tablet by mouth daily. Reported on 03/19/2015, Disp: , Rfl:    naproxen (NAPROSYN) 500  MG tablet, Take one po bid x 1 week then prn pain, take with food (Patient not taking: Reported on 03/21/2016), Disp: 30 tablet, Rfl: 0   valACYclovir (VALTREX) 1000 MG tablet, Take 2 tablets (2,000 mg total) by mouth 2 (two) times daily. For 1 day at a time, Disp: 20 tablet, Rfl: 0  Observations/Objective: Patient is well-developed, well-nourished in no acute distress.  Resting comfortably at home.  Head is normocephalic, atraumatic.  No labored breathing.  Speech is clear and coherent with logical content.  Patient is alert and oriented at baseline.    Assessment and Plan: 1. Right upper quadrant abdominal pain Advised patient to have follow up with PCP and consider CT abdomen.  Unable to order imaging through telehealth currently.   Discussed Ddx including gallbladder/constipation/appendix.  If pain acutely worsens seek immediate medical attention as discussed.  May continue to use probiotic and Miralax as needed.      Follow Up Instructions: I discussed the assessment and treatment plan with the patient. The patient was provided an opportunity to ask questions and all were answered. The patient agreed with the plan and demonstrated an understanding of the instructions.  A copy of instructions were sent to the patient via MyChart unless otherwise noted below.     The patient was advised to call back or seek an in-person evaluation if the symptoms worsen or if the condition fails to improve as anticipated.  Time:  I spent 15 minutes with the patient via telehealth technology discussing the above problems/concerns.    Apolonio Schneiders, FNP

## 2021-04-13 ENCOUNTER — Encounter: Payer: Self-pay | Admitting: Family Medicine

## 2021-04-13 ENCOUNTER — Other Ambulatory Visit: Payer: Self-pay

## 2021-04-13 ENCOUNTER — Ambulatory Visit (INDEPENDENT_AMBULATORY_CARE_PROVIDER_SITE_OTHER): Payer: Commercial Managed Care - PPO | Admitting: Family Medicine

## 2021-04-13 ENCOUNTER — Other Ambulatory Visit (INDEPENDENT_AMBULATORY_CARE_PROVIDER_SITE_OTHER): Payer: Commercial Managed Care - PPO | Admitting: Family Medicine

## 2021-04-13 VITALS — BP 120/68 | HR 80 | Temp 97.9°F | Ht 70.0 in | Wt 210.4 lb

## 2021-04-13 DIAGNOSIS — Z114 Encounter for screening for human immunodeficiency virus [HIV]: Secondary | ICD-10-CM

## 2021-04-13 DIAGNOSIS — R1010 Upper abdominal pain, unspecified: Secondary | ICD-10-CM | POA: Insufficient documentation

## 2021-04-13 DIAGNOSIS — Z23 Encounter for immunization: Secondary | ICD-10-CM

## 2021-04-13 DIAGNOSIS — R1033 Periumbilical pain: Secondary | ICD-10-CM | POA: Insufficient documentation

## 2021-04-13 DIAGNOSIS — K219 Gastro-esophageal reflux disease without esophagitis: Secondary | ICD-10-CM | POA: Diagnosis not present

## 2021-04-13 DIAGNOSIS — E785 Hyperlipidemia, unspecified: Secondary | ICD-10-CM

## 2021-04-13 DIAGNOSIS — Z1159 Encounter for screening for other viral diseases: Secondary | ICD-10-CM

## 2021-04-13 LAB — CBC WITH DIFFERENTIAL/PLATELET
Basophils Absolute: 0.1 10*3/uL (ref 0.0–0.1)
Basophils Relative: 0.9 % (ref 0.0–3.0)
Eosinophils Absolute: 0.1 10*3/uL (ref 0.0–0.7)
Eosinophils Relative: 2.1 % (ref 0.0–5.0)
HCT: 44.7 % (ref 39.0–52.0)
Hemoglobin: 15 g/dL (ref 13.0–17.0)
Lymphocytes Relative: 40.4 % (ref 12.0–46.0)
Lymphs Abs: 2.4 10*3/uL (ref 0.7–4.0)
MCHC: 33.5 g/dL (ref 30.0–36.0)
MCV: 88 fl (ref 78.0–100.0)
Monocytes Absolute: 0.5 10*3/uL (ref 0.1–1.0)
Monocytes Relative: 9.3 % (ref 3.0–12.0)
Neutro Abs: 2.8 10*3/uL (ref 1.4–7.7)
Neutrophils Relative %: 47.3 % (ref 43.0–77.0)
Platelets: 260 10*3/uL (ref 150.0–400.0)
RBC: 5.08 Mil/uL (ref 4.22–5.81)
RDW: 13.1 % (ref 11.5–15.5)
WBC: 5.9 10*3/uL (ref 4.0–10.5)

## 2021-04-13 LAB — LIPID PANEL
Cholesterol: 208 mg/dL — ABNORMAL HIGH (ref 0–200)
HDL: 43.9 mg/dL (ref >39.00)
NonHDL: 163.74
Total CHOL/HDL Ratio: 5
Triglycerides: 244 mg/dL — ABNORMAL HIGH (ref 0.0–149.0)
VLDL: 48.8 mg/dL — ABNORMAL HIGH (ref 0.0–40.0)

## 2021-04-13 LAB — COMPREHENSIVE METABOLIC PANEL
ALT: 25 U/L (ref 0–53)
AST: 23 U/L (ref 0–37)
Albumin: 4.9 g/dL (ref 3.5–5.2)
Alkaline Phosphatase: 49 U/L (ref 39–117)
BUN: 18 mg/dL (ref 6–23)
CO2: 27 mEq/L (ref 19–32)
Calcium: 9.5 mg/dL (ref 8.4–10.5)
Chloride: 100 mEq/L (ref 96–112)
Creatinine, Ser: 1.15 mg/dL (ref 0.40–1.50)
GFR: 77.11 mL/min (ref >60.00)
Glucose, Bld: 77 mg/dL (ref 70–99)
Potassium: 4 mEq/L (ref 3.5–5.1)
Sodium: 136 mEq/L (ref 135–145)
Total Bilirubin: 1.5 mg/dL — ABNORMAL HIGH (ref 0.2–1.2)
Total Protein: 7.9 g/dL (ref 6.0–8.3)

## 2021-04-13 LAB — LIPASE: Lipase: 14 U/L (ref 11.0–59.0)

## 2021-04-13 LAB — LDL CHOLESTEROL, DIRECT: Direct LDL: 132 mg/dL

## 2021-04-13 NOTE — Patient Instructions (Addendum)
Flu shot today Labs today  Return 04/23/2021 for physical.  Start taking miralax and probiotic daily for the next 1-2 weeks, if beneficial may continue.  Let us know if recurrent symptoms.

## 2021-04-13 NOTE — Assessment & Plan Note (Signed)
No recent trouble with GERD.

## 2021-04-13 NOTE — Addendum Note (Signed)
Addended by: Alvina Chou on: 04/13/2021 02:25 PM   Modules accepted: Orders

## 2021-04-13 NOTE — Progress Notes (Signed)
Patient ID: Shaun Phillips, male    DOB: December 28, 1975, 46 y.o.   MRN: [id number]  This visit was conducted in person.  BP 120/68    Pulse 80    Temp 97.9 F (36.6 C) (Temporal)    Ht 5\' 10"  (1.778 m)    Wt 210 lb 7 oz (95.5 kg)    SpO2 98%    BMI 30.19 kg/m    CC: abdominal pain  Subjective:   HPI: Shaun Phillips is a 46 y.o. male presenting on 04/13/2021 for GI Problem (C/o abd pain in umbilical region. Started 04/01/21.  Felt sharp pains after working out. Seen at [facility] on 04/02/21.  Told he may have hernia and prescribed Robaxin- not taking.  Had video visit on 04/06/21 after returning from ski trip.  Told he may have gallstones and suggested pt have CT. Tried probiotic, helpful and tried Miralax. )   Last seen here 03/2016.  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   Almost 2 wks h/o sharp periumbilical abdominal pain that started after working out - seen at [facility] suspected abdominal wall hernia treated with robaxin (didn't try this). In interim went on ski tip to [facility] - no pain while skiing. Seen again last week virtually with RUQ pain ?gallstones - suggested CT scan.   Acute pain started at navel initially noted at bedtime after regular workout session, described as sore discomfort as well as burning pain to navel. Took ibuprofen with benefit. Next day he again worked out (lower body) - noted pain to navel, better when putting pressure. This was associated with bloating and some constipation. Had some frequent urination.  Different diet over the holidays.   He's been taking nature's own probiotic with benefit.  Also tried miralax QOD.  All of this has helped.   No fevers/chills, heartburn, indigestion, nausea/vomiting, diarrhea, blood in stool. No rectal pressure pain, dysuria, blood in stool, no boring pain to back.  No dyspnea, headache, chest pain.   COVID infection 01/2019.  COVID vaccine x2, booster x1 - will bring in  card.      Relevant past medical, surgical, family and social history reviewed and updated as indicated. Interim medical history since our last visit reviewed. Allergies and medications reviewed and updated. Outpatient Medications Prior to Visit  Medication Sig Dispense Refill   Multiple Vitamins-Minerals (MULTIVITAMIN & MINERAL PO) Take 1 tablet by mouth daily. Reported on 03/19/2015     methocarbamol (ROBAXIN) 500 MG tablet PLEASE SEE ATTACHED FOR DETAILED DIRECTIONS (Patient not taking: Reported on 04/13/2021)     naproxen (NAPROSYN) 500 MG tablet Take one po bid x 1 week then prn pain, take with food (Patient not taking: Reported on 03/21/2016) 30 tablet 0   valACYclovir (VALTREX) 1000 MG tablet Take 2 tablets (2,000 mg total) by mouth 2 (two) times daily. For 1 day at a time 20 tablet 0   No facility-administered medications prior to visit.     Per HPI unless specifically indicated in ROS section below Review of Systems  Objective:  BP 120/68    Pulse 80    Temp 97.9 F (36.6 C) (Temporal)    Ht 5\' 10"  (1.778 m)    Wt 210 lb 7 oz (95.5 kg)    SpO2 98%    BMI 30.19 kg/m   Wt Readings from Last 3 Encounters:  04/13/21 210 lb 7 oz (95.5 kg)  03/21/16 215 lb (97.5 kg)  11/04/15 217 lb 12 oz (98.8 kg)      Physical Exam Vitals and nursing note reviewed.  Constitutional:      Appearance: Normal appearance. He is not ill-appearing.  HENT:     Head: Normocephalic and atraumatic.  Eyes:     Extraocular Movements: Extraocular movements intact.     Pupils: Pupils are equal, round, and reactive to light.  Cardiovascular:     Rate and Rhythm: Normal rate and regular rhythm.     Pulses: Normal pulses.     Heart sounds: Normal heart sounds. No murmur heard. Pulmonary:     Effort: Pulmonary effort is normal. No respiratory distress.     Breath sounds: Normal breath sounds. No wheezing, rhonchi or rales.  Abdominal:     General: Abdomen is flat. Bowel sounds are normal. There is no  distension.     Palpations: Abdomen is soft. There is no mass.     Tenderness: There is no abdominal tenderness. There is no right CVA tenderness, left CVA tenderness, guarding or rebound. Negative signs include Murphy's sign.     Hernia: No hernia is present.     Comments: No obvious hernias appreciated  Musculoskeletal:     Right lower leg: No edema.     Left lower leg: No edema.  Skin:    General: Skin is warm and dry.     Findings: No rash.  Neurological:     Mental Status: He is alert.  Psychiatric:        Mood and Affect: Mood normal.        Behavior: Behavior normal.       Assessment & Plan:  This visit occurred during the SARS-CoV-2 public health emergency.  Safety protocols were in place, including screening questions prior to the visit, additional usage of staff PPE, and extensive cleaning of exam room while observing appropriate contact time as indicated for disinfecting solutions.   Problem List Items Addressed This Visit     GERD    No recent trouble with GERD.       Periumbilical abdominal pain - Primary    Episodes of periumbilical soreness around time of poor holiday diet, not consistent with biliary colic. No obvious hernia on exam today. He has noted improvement since starting PRN miralax and probiotic - rec continue this. ?constipation related abdominal discomfort. No red flags. Reassess at CPE later this month, consider imaging if ongoing.       Relevant Orders   Comprehensive metabolic panel   CBC with Differential/Platelet   Lipase   Other Visit Diagnoses     Need for influenza vaccination       Relevant Orders   Flu Vaccine QUAD 18mo+IM (Fluarix, Fluzone & Alfiuria Quad PF) (Completed)        No orders of the defined types were placed in this encounter.  Orders Placed This Encounter  Procedures   Flu Vaccine QUAD 27mo+IM (Fluarix, Fluzone & Alfiuria Quad PF)   Comprehensive metabolic panel   CBC with Differential/Platelet   Lipase      Patient Instructions  Flu shot today Labs today  Return 04/23/2021 for physical.  Start taking miralax and probiotic daily for the next 1-2 weeks, if beneficial may continue.  Let us know if recurrent symptoms.   Follow up plan: Return if symptoms worsen or fail to improve.  Eustaquio Boyden, MD

## 2021-04-13 NOTE — Progress Notes (Signed)
Labs ordered for upcoming physical.

## 2021-04-13 NOTE — Assessment & Plan Note (Signed)
Episodes of periumbilical soreness around time of poor holiday diet, not consistent with biliary colic. No obvious hernia on exam today. He has noted improvement since starting PRN miralax and probiotic - rec continue this. ?constipation related abdominal discomfort. No red flags. Reassess at CPE later this month, consider imaging if ongoing.

## 2021-04-14 LAB — HIV ANTIBODY (ROUTINE TESTING W REFLEX): HIV 1&2 Ab, 4th Generation: NONREACTIVE

## 2021-04-14 LAB — HEPATITIS C ANTIBODY
Hepatitis C Ab: NONREACTIVE
SIGNAL TO CUT-OFF: 0.29

## 2021-04-23 ENCOUNTER — Encounter: Payer: Self-pay | Admitting: Family Medicine

## 2021-04-23 ENCOUNTER — Other Ambulatory Visit: Payer: Self-pay

## 2021-04-23 ENCOUNTER — Encounter: Payer: Self-pay | Admitting: *Deleted

## 2021-04-23 ENCOUNTER — Ambulatory Visit (INDEPENDENT_AMBULATORY_CARE_PROVIDER_SITE_OTHER): Payer: Commercial Managed Care - PPO | Admitting: Family Medicine

## 2021-04-23 VITALS — BP 130/74 | HR 74 | Temp 98.0°F | Ht 70.5 in | Wt 210.3 lb

## 2021-04-23 DIAGNOSIS — E785 Hyperlipidemia, unspecified: Secondary | ICD-10-CM

## 2021-04-23 DIAGNOSIS — R1033 Periumbilical pain: Secondary | ICD-10-CM

## 2021-04-23 DIAGNOSIS — Z1211 Encounter for screening for malignant neoplasm of colon: Secondary | ICD-10-CM | POA: Diagnosis not present

## 2021-04-23 DIAGNOSIS — Z0000 Encounter for general adult medical examination without abnormal findings: Secondary | ICD-10-CM | POA: Diagnosis not present

## 2021-04-23 MED ORDER — DICYCLOMINE HCL 20 MG PO TABS: 20.0000 mg | ORAL_TABLET | Freq: Four times a day (QID) | ORAL | 0 refills | Status: DC | PRN

## 2021-04-23 NOTE — Assessment & Plan Note (Addendum)
Preventative protocols reviewed and updated unless pt declined. Discussed healthy diet and lifestyle.  Refer to GI for colonoscopy - [place] per pt preference.

## 2021-04-23 NOTE — Progress Notes (Signed)
Patient ID: Shaun Phillips, male    DOB: November 24, 1975, 46 y.o.   MRN: [id number]  This visit was conducted in person.  BP 130/74    Pulse 74    Temp 98 F (36.7 C) (Temporal)    Ht 5' 10.5" (1.791 m)    Wt 210 lb 5 oz (95.4 kg)    SpO2 95%    BMI 29.75 kg/m    CC: CPE Subjective:   HPI: Shaun Phillips is a 46 y.o. male presenting on 04/23/2021 for Annual Exam   See prior note for details. Seen here 1.5 wks ago with episodes of periumbilical soreness around time of poor holiday diet. ?constipation. Labwork reassuring. Treated with miralax and probiotic. Notes ongoing periumbilical and RUQ discomfort - gassiness, bloating, indigestion. No heartburn or GERD symptoms. Stools are normal to soft - no constipation.  EGD 2009 - HH, GERD.   No recent tick bites.   Preventative: Colon cancer screening - discussed options - would [facility] to [facility]ry [facility] [place].  Flu shot - yearly COVID vaccine - 06/2019, 07/2019, booster x1  Td 2009 - postponed  Seat belt use disucssed Sunscreen use discussed. No changing moles on skin. Sees derm yearly.  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 Interim medical history since our last visit reviewed. Allergies and medications reviewed and updated. Outpatient Medications Prior to Visit  Medication Sig Dispense Refill   Multiple Vitamins-Minerals (MULTIVITAMIN & MINERAL PO) Take 1 tablet by mouth daily. Reported on 03/19/2015     methocarbamol (ROBAXIN) 500 MG tablet PLEASE SEE ATTACHED FOR DETAILED DIRECTIONS (Patient not taking: Reported on 04/13/2021)      No facility-administered medications prior to visit.     Per HPI unless specifically indicated in ROS section below Review of Systems  Constitutional:  Negative for activity change, appetite change, chills, fatigue, fever and unexpected weight change.  HENT:  Negative for hearing loss.   Eyes:  Negative for visual disturbance.  Respiratory:  Negative for cough, chest tightness, shortness of breath and wheezing.   Cardiovascular:  Negative for chest pain, palpitations and leg swelling.  Gastrointestinal:  Positive for abdominal pain. Negative for abdominal distention, blood in stool, constipation, diarrhea, nausea and vomiting.  Genitourinary:  Negative for difficulty urinating and hematuria.  Musculoskeletal:  Negative for arthralgias, myalgias and neck pain.  Skin:  Negative for rash.  Neurological:  Negative for dizziness, seizures, syncope and headaches.  Hematological:  Negative for adenopathy. Does not bruise/bleed easily.  Psychiatric/Behavioral:  Negative for dysphoric mood. The patient is not nervous/anxious.    Objective:  BP 130/74    Pulse 74    Temp 98 F (36.7 C) (Temporal)    Ht 5' 10.5" (1.791 m)    Wt 210 lb 5 oz (95.4 kg)    SpO2 95%    BMI 29.75 kg/m   Wt Readings from Last 3 Encounters:  04/23/21 210 lb 5 oz (95.4 kg)  04/13/21 210 lb 7 oz (95.5 kg)  03/21/16 215 lb (97.5 kg)      Physical Exam Vitals and  nursing note reviewed.  Constitutional:      General: He is not in acute distress.    Appearance: Normal appearance. He is well-developed. He is not ill-appearing.  HENT:     Head: Normocephalic and atraumatic.     Right Ear: Hearing, tympanic membrane, ear canal and external ear normal.     Left Ear: Hearing, tympanic membrane, ear canal and external ear normal.  Eyes:     General: No scleral icterus.    Extraocular Movements: Extraocular movements intact.     Conjunctiva/sclera: Conjunctivae normal.     Pupils: Pupils are equal, round, and reactive  to light.  Neck:     Thyroid: No thyroid mass or thyromegaly.  Cardiovascular:     Rate and Rhythm: Normal rate and regular rhythm.     Pulses: Normal pulses.          Radial pulses are 2+ on the right side and 2+ on the left side.     Heart sounds: Normal heart sounds. No murmur heard. Pulmonary:     Effort: Pulmonary effort is normal. No respiratory distress.     Breath sounds: Normal breath sounds. No wheezing, rhonchi or rales.  Abdominal:     General: Bowel sounds are normal. There is no distension.     Palpations: Abdomen is soft. There is no mass.     Tenderness: There is no abdominal tenderness. There is no guarding or rebound.     Hernia: No hernia is present.  Musculoskeletal:        General: Normal range of motion.     Cervical back: Normal range of motion and neck supple.     Right lower leg: No edema.     Left lower leg: No edema.  Lymphadenopathy:     Cervical: No cervical adenopathy.  Skin:    General: Skin is warm and dry.     Findings: No rash.  Neurological:     General: No focal deficit present.     Mental Status: He is alert and oriented to person, place, and time.  Psychiatric:        Mood and Affect: Mood normal.        Behavior: Behavior normal.        Thought Content: Thought content normal.        Judgment: Judgment normal.      Results for orders placed or performed in visit on 04/13/21  Lipid panel  Result Value Ref Range   Cholesterol 208 (H) 0 - 200 mg/dL   Triglycerides 244.0 (H) 0.0 - 149.0 mg/dL   HDL 43.90 >39.00 mg/dL   VLDL 48.8 (H) 0.0 - 40.0 mg/dL   Total CHOL/HDL Ratio 5    NonHDL 163.74     Assessment & Plan:  This visit occurred during the SARS-CoV-2 public health emergency.  Safety protocols were in place, including screening questions prior to the visit, additional usage of staff PPE, and extensive cleaning of exam room while observing appropriate contact time as indicated for disinfecting solutions.   Problem List Items  Addressed This Visit     Routine general medical examination at a health care facility - Primary (Chronic)    Preventative protocols reviewed and updated unless pt declined. Discussed healthy diet and lifestyle.  Refer to GI for colonoscopy - [place] per pt preference.       Dyslipidemia    Chronic, deteriorated in setting of recent vacation with altered eating from normal diet. Encouraged diet choices to improve  cholesterol levels. The 10-year ASCVD risk score (Arnett DK, et al., 2019) is: 2.6%   Values used to calculate the score:     Age: 18 years     Sex: Male     Is Non-Hispanic African American: No     Diabetic: No     Tobacco smoker: No     Systolic Blood Pressure: [id number] mmHg     Is BP treated: No     HDL Cholesterol: 43.9 mg/dL     Total Cholesterol: 208 mg/dL       Periumbilical abdominal pain    Ongoing - miralax, probiotic not very beneficial.  No red flags. Recent labwork reassuring.  Will check abdominal ultrasound for further evaluation.  If unrevealing, consider GI evaluation.       Relevant Orders   US Abdomen Complete   Ambulatory referral to Gastroenterology   Other Visit Diagnoses     Special screening for malignant neoplasms, colon       Relevant Orders   Ambulatory referral to Gastroenterology        Meds ordered this encounter  Medications   dicyclomine (BENTYL) 20 MG tablet    Sig: Take 1 tablet (20 mg total) by mouth 4 (four) times daily as needed for spasms.    Dispense:  40 tablet    Refill:  0   Orders Placed This Encounter  Procedures   US Abdomen Complete    Standing Status:   Future    Standing Expiration Date:   04/23/2022    Order Specific Question:   Reason for Exam (SYMPTOM  OR DIAGNOSIS REQUIRED)    Answer:   upper abdominal pain    Order Specific Question:   Preferred imaging location?    Answer:   [facility]   Ambulatory referral to Gastroenterology    Referral Priority:   Routine    Referral Type:   Consultation     Referral Reason:   Specialty Services Required    Number of Visits Requested:   1     Patient instructions: Will order abdominal ultrasound.  We will refer you to GI for colon cancer screening.  Call us back to schedule tetanus shot nurse visit (Tdap).  Try bentyl antispasmodic as needed for symptoms.  Good to see you today.   Follow up plan: Return if symptoms worsen or fail to improve.  Ria Bush, MD

## 2021-04-23 NOTE — Assessment & Plan Note (Signed)
Ongoing - miralax, probiotic not very beneficial.  No red flags. Recent labwork reassuring.  Will check abdominal ultrasound for further evaluation.  If unrevealing, consider GI evaluation.

## 2021-04-23 NOTE — Patient Instructions (Addendum)
Will order abdominal ultrasound.  We will refer you to GI for colon cancer screening.  Call us back to schedule tetanus shot nurse visit (Tdap).  Try bentyl antispasmodic as needed for symptoms.  Good to see you today.  We will be in touch with results.   Health Maintenance, Male Adopting a healthy lifestyle and getting preventive care are important in promoting health and wellness. Ask your health care provider about: The right schedule for you to have regular tests and exams. Things you can do on your own to prevent diseases and keep yourself healthy. What should I know about diet, weight, and exercise? Eat a healthy diet  Eat a diet that includes plenty of vegetables, fruits, low-fat dairy products, and lean protein. Do not eat a lot of foods that are high in solid fats, added sugars, or sodium. Maintain a healthy weight Body mass index (BMI) is a measurement that can be used to identify possible weight problems. It estimates body fat based on height and weight. Your health care provider can help determine your BMI and help you achieve or maintain a healthy weight. Get regular exercise Get regular exercise. This is one of the most important things you can do for your health. Most adults should: Exercise for at least 150 minutes each week. The exercise should increase your heart rate and make you sweat (moderate-intensity exercise). Do strengthening exercises at least twice a week. This is in addition to the moderate-intensity exercise. Spend less time sitting. Even light physical activity can be beneficial. Watch cholesterol and blood lipids Have your blood tested for lipids and cholesterol at 46 years of age, then have this test every 5 years. You may need to have your cholesterol levels checked more often if: Your lipid or cholesterol levels are high. You are older than 45 years of age. You are at high risk for heart disease. What should I know about cancer screening? Many types of  cancers can be detected early and may often be prevented. Depending on your health history and family history, you may need to have cancer screening at various ages. This may include screening for: Colorectal cancer. Prostate cancer. Skin cancer. Lung cancer. What should I know about heart disease, diabetes, and high blood pressure? Blood pressure and heart disease High blood pressure causes heart disease and increases the risk of stroke. This is more likely to develop in people who have high blood pressure readings or are overweight. Talk with your health care provider about your target blood pressure readings. Have your blood pressure checked: Every 3-5 years if you are 108-58 years of age. Every year if you are 15 years old or older. If you are between the ages of 36 and 106 and are a current or former smoker, ask your health care provider if you should have a one-time screening for abdominal aortic aneurysm (AAA). Diabetes Have regular diabetes screenings. This checks your fasting blood sugar level. Have the screening done: Once every three years after age 25 if you are at a normal weight and have a low risk for diabetes. More often and at a younger age if you are overweight or have a high risk for diabetes. What should I know about preventing infection? Hepatitis B If you have a higher risk for hepatitis B, you should be screened for this virus. Talk with your health care provider to find out if you are at risk for hepatitis B infection. Hepatitis C Blood testing is recommended for: Everyone born from  1945 through 64. Anyone with known risk factors for hepatitis C. Sexually transmitted infections (STIs) You should be screened each year for STIs, including gonorrhea and chlamydia, if: You are sexually active and are younger than 46 years of age. You are older than 46 years of age and your health care provider tells you that you are at risk for this type of infection. Your sexual  activity has changed since you were last screened, and you are at increased risk for chlamydia or gonorrhea. Ask your health care provider if you are at risk. Ask your health care provider about whether you are at high risk for HIV. Your health care provider may recommend a prescription medicine to help prevent HIV infection. If you choose to take medicine to prevent HIV, you should first get tested for HIV. You should then be tested every 3 months for as long as you are taking the medicine. Follow these instructions at home: Alcohol use Do not drink alcohol if your health care provider tells you not to drink. If you drink alcohol: Limit how much you have to 0-2 drinks a day. Know how much alcohol is in your drink. In the [place] one drink equals one 12 oz bottle of beer (355 mL), one 5 oz glass of wine (148 mL), or one 1 oz glass of hard liquor (44 mL). Lifestyle Do not use any products that contain nicotine or tobacco. These products include cigarettes, chewing tobacco, and vaping devices, such as e-cigarettes. If you need help quitting, ask your health care provider. Do not use street drugs. Do not share needles. Ask your health care provider for help if you need support or information about quitting drugs. General instructions Schedule regular health, dental, and eye exams. Stay current with your vaccines. Tell your health care provider if: You often feel depressed. You have ever been abused or do not feel safe at home. Summary Adopting a healthy lifestyle and getting preventive care are important in promoting health and wellness. Follow your health care provider's instructions about healthy diet, exercising, and getting tested or screened for diseases. Follow your health care provider's instructions on monitoring your cholesterol and blood pressure. This information is not intended to replace advice given to you by your health care provider. Make sure you discuss any questions you have  with your health care provider. Document Revised: 08/10/2020 Document Reviewed: 08/10/2020 Elsevier Patient Education  2022 [employer].

## 2021-04-23 NOTE — Assessment & Plan Note (Signed)
Chronic, deteriorated in setting of recent vacation with altered eating from normal diet. Encouraged diet choices to improve cholesterol levels. The 10-year ASCVD risk score (Arnett DK, et al., 2019) is: 2.6%   Values used to calculate the score:     Age: 46 years     Sex: Male     Is Non-Hispanic African American: No     Diabetic: No     Tobacco smoker: No     Systolic Blood Pressure: 130 mmHg     Is BP treated: No     HDL Cholesterol: 43.9 mg/dL     Total Cholesterol: 208 mg/dL

## 2021-05-05 ENCOUNTER — Ambulatory Visit
Admission: RE | Admit: 2021-05-05 | Discharge: 2021-05-05 | Disposition: A | Discharge status: Home / Self Care | Payer: Commercial Managed Care - PPO | Source: Ambulatory Visit | Attending: Family Medicine | Admitting: Family Medicine

## 2021-05-05 ENCOUNTER — Other Ambulatory Visit: Payer: Self-pay

## 2021-05-05 DIAGNOSIS — R1033 Periumbilical pain: Secondary | ICD-10-CM

## 2021-05-06 ENCOUNTER — Encounter: Payer: Self-pay | Admitting: Family Medicine

## 2021-08-02 HISTORY — PX: COLONOSCOPY: SHX174

## 2021-08-06 LAB — HM COLONOSCOPY

## 2021-11-02 ENCOUNTER — Encounter: Payer: Self-pay | Admitting: Family Medicine

## 2021-11-02 NOTE — Telephone Encounter (Signed)
error 

## 2022-01-25 ENCOUNTER — Ambulatory Visit (INDEPENDENT_AMBULATORY_CARE_PROVIDER_SITE_OTHER): Payer: Commercial Managed Care - PPO | Admitting: Family Medicine

## 2022-01-25 ENCOUNTER — Encounter: Payer: Self-pay | Admitting: Family Medicine

## 2022-01-25 VITALS — BP 124/74 | HR 90 | Temp 97.7°F | Ht 70.5 in | Wt 215.2 lb

## 2022-01-25 DIAGNOSIS — Z23 Encounter for immunization: Secondary | ICD-10-CM | POA: Diagnosis not present

## 2022-01-25 DIAGNOSIS — R1010 Upper abdominal pain, unspecified: Secondary | ICD-10-CM

## 2022-01-25 NOTE — Progress Notes (Unsigned)
Patient ID: Mclean Moya, male    DOB: Aug 02, 1975, 46 y.o.   MRN: [id number]  This visit was conducted in person.  BP 124/74   Pulse 90   Temp 97.7 F (36.5 C) (Temporal)   Ht 5' 10.5" (1.791 m)   Wt 215 lb 4 oz (97.6 kg)   SpO2 94%   BMI 30.45 kg/m    CC: abdominal pain  Subjective:   HPI: Jaquawn Saffran is a 46 y.o. male presenting on 01/25/2022 for Abdominal Pain (C/o epigastric abd pain.  Sxs started 2-3 wks ago. Denies N/V/D.  Tried Nexium,probiotic and Bentyl.  Bentyl seems to help a little. H/o of abd pain. Pt wanders if issues digesting nuts. )   Last seen for periumbilical discomfort early in the year. Workup included normal CBC, CMP, lipase and abd [place]. At that time I did recommend colonoscopy - referred to Dr Bethanie Dicker [facility] in [place] - WNL per pt. Periumbilical discomfort did resolve.   2-3 wks of RUQ and epigastric abdominal discomfort described as sharp stabbing discomfort, improved with meals. No severe pain, just uncomfortable. This started after eating a whole bag of trail mix (walnuts, blueberries, nuts, raisins, cranberries etc). He was also eating a lot more ground beef and [place]. Pain not activity limiting. He started taking OTC nexium x10d course as well as probiotic and limited caffeine and alcohol intake. This seemed to improve symptoms.   No fevers/chills, nausea/vomiting, diarrhea/constipation, urinary symptoms. No GERD, reflux, heartburn symptoms. No unexpected weight loss, night sweats, dysphagia or early satiety.  No skin rashes.  High stress recently.  No regular NSAID use.   No trouble with milk or dairy products.  No trouble with red meats.  No trouble with gluten containing foods.   Previously tried miralax, probiotic not very helpful.  Bentyl does seem to help symptoms.   Abd [place] 05/2021 - WNL EGD 2009 - HH, GERD      Relevant past medical, surgical, family and social history reviewed and updated as indicated. Interim  medical history since our last visit reviewed. Allergies and medications reviewed and updated. Outpatient Medications Prior to Visit  Medication Sig Dispense Refill   dicyclomine (BENTYL) 20 MG tablet Take 1 tablet (20 mg total) by mouth 4 (four) times daily as needed for spasms. 40 tablet 0   Multiple Vitamins-Minerals (MULTIVITAMIN & MINERAL PO) Take 1 tablet by mouth daily. Reported on 03/19/2015     No facility-administered medications prior to visit.     Per HPI unless specifically indicated in ROS section below Review of Systems  Objective:  BP 124/74   Pulse 90   Temp 97.7 F (36.5 C) (Temporal)   Ht 5' 10.5" (1.791 m)   Wt 215 lb 4 oz (97.6 kg)   SpO2 94%   BMI 30.45 kg/m   Wt Readings from Last 3 Encounters:  01/25/22 215 lb 4 oz (97.6 kg)  04/23/21 210 lb 5 oz (95.4 kg)  04/13/21 210 lb 7 oz (95.5 kg)      Physical Exam Vitals and nursing note reviewed.  Constitutional:      Appearance: Normal appearance. He is not ill-appearing.  HENT:     Head: Normocephalic and atraumatic.     Mouth/Throat:     Mouth: Mucous membranes are moist.     Pharynx: Oropharynx is clear. No oropharyngeal exudate or posterior oropharyngeal erythema.  Eyes:     Extraocular Movements: Extraocular movements intact.     Pupils: Pupils  are equal, round, and reactive to light.  Cardiovascular:     Rate and Rhythm: Normal rate and regular rhythm.     Pulses: Normal pulses.     Heart sounds: Normal heart sounds. No murmur heard. Pulmonary:     Effort: Pulmonary effort is normal. No respiratory distress.     Breath sounds: Normal breath sounds. No wheezing, rhonchi or rales.  Abdominal:     General: Bowel sounds are normal. There is no distension.     Palpations: Abdomen is soft. There is no hepatomegaly, splenomegaly or mass.     Tenderness: There is no abdominal tenderness. There is no right CVA tenderness, left CVA tenderness, guarding or rebound. Negative signs include Murphy's sign.      Hernia: No hernia is present.  Musculoskeletal:     Right lower leg: No edema.     Left lower leg: No edema.  Skin:    General: Skin is warm and dry.     Findings: No rash.  Neurological:     Mental Status: He is alert.  Psychiatric:        Mood and Affect: Mood normal.        Behavior: Behavior normal.       Results for orders placed or performed in visit on 01/25/22  Comprehensive metabolic panel  Result Value Ref Range   Sodium 138 135 - 145 mEq/L   Potassium 3.9 3.5 - 5.1 mEq/L   Chloride 103 96 - 112 mEq/L   CO2 27 19 - 32 mEq/L   Glucose, Bld 90 70 - 99 mg/dL   BUN 26 (H) 6 - 23 mg/dL   Creatinine, Ser 1.60 0.40 - 1.50 mg/dL   Total Bilirubin 1.2 0.2 - 1.2 mg/dL   Alkaline Phosphatase 50 39 - 117 U/L   AST 26 0 - 37 U/L   ALT 21 0 - 53 U/L   Total Protein 7.5 6.0 - 8.3 g/dL   Albumin 4.8 3.5 - 5.2 g/dL   GFR 10.93 >23.55 mL/min   Calcium 9.4 8.4 - 10.5 mg/dL  CBC with Differential/Platelet  Result Value Ref Range   WBC 11.5 (H) 4.0 - 10.5 K/uL   RBC 4.81 4.22 - 5.81 Mil/uL   Hemoglobin 14.3 13.0 - 17.0 g/dL   HCT 73.2 20.2 - 54.2 %   MCV 87.6 78.0 - 100.0 fl   MCHC 34.1 30.0 - 36.0 g/dL   RDW 70.6 23.7 - 62.8 %   Platelets 263.0 150.0 - 400.0 K/uL   Neutrophils Relative % 71.2 43.0 - 77.0 %   Lymphocytes Relative 18.8 12.0 - 46.0 %   Monocytes Relative 7.0 3.0 - 12.0 %   Eosinophils Relative 2.4 0.0 - 5.0 %   Basophils Relative 0.6 0.0 - 3.0 %   Neutro Abs 8.2 (H) 1.4 - 7.7 K/uL   Lymphs Abs 2.2 0.7 - 4.0 K/uL   Monocytes Absolute 0.8 0.1 - 1.0 K/uL   Eosinophils Absolute 0.3 0.0 - 0.7 K/uL   Basophils Absolute 0.1 0.0 - 0.1 K/uL  Lipase  Result Value Ref Range   Lipase 12.0 11.0 - 59.0 U/L   US Abdomen Complete CLINICAL DATA:  Periumbilical abdominal pain.  EXAM: ABDOMEN ULTRASOUND COMPLETE  COMPARISON:  None.  FINDINGS: Gallbladder: No gallstones or wall thickening visualized (1.6 mm). No sonographic Murphy sign noted by  sonographer.  Common bile duct: Diameter: 2.2 mm  Liver: No focal lesion identified. Within normal limits in parenchymal echogenicity. Portal vein is patent on  color Doppler imaging with normal direction of blood flow towards the liver.  IVC: No abnormality visualized.  Pancreas: Visualized portion unremarkable.  Spleen: Size (5.7 cm) and appearance within normal limits.  Right Kidney: Length: 12.03 cm. Echogenicity within normal limits. No mass or hydronephrosis visualized.  Left Kidney: Length: 12.76 cm. Echogenicity within normal limits. No mass or hydronephrosis visualized.  Abdominal aorta: No aneurysm visualized (1.81 cm in AP diameter).  Other findings: None.  IMPRESSION: Normal abdominal ultrasound.  Electronically Signed   By: Virgina Norfolk M.D.   On: 05/05/2021 17:14   Assessment & Plan:   Problem List Items Addressed This Visit     Upper abdominal pain - Primary    Describes intermittent sharp upper abdominal/RUQ discomfort that did seem to improve with food intake, but not associated with reflux/heartburn symptoms. Pain has currently resolved. Reviewed workup from 04/2021 including labwork and abdominal US - overall reassuring. He also had remote endoscopy 2009 showing hiatal hernia and GERD.  Overall reassuring exam today. ?IBS, gallstone, GERD, gastritis, pancreatitis. Did seem to improve with nexium and dropping caffeine/alcohol use. Will further evaluate with labwork given location of discomfort. Will also check H pylori Ag after being off nexium for 2 wks.  Not consistent with lactose or other food intolerance or allergy.  Discussed pepcid use.       Relevant Orders   Comprehensive metabolic panel (Completed)   CBC with Differential/Platelet (Completed)   Helicobacter pylori special antigen   Lipase (Completed)   Other Visit Diagnoses     Need for influenza vaccination       Relevant Orders   Flu Vaccine QUAD 70mo+IM (Fluarix, Fluzone & Alfiuria  Quad PF) (Completed)        No orders of the defined types were placed in this encounter.  Orders Placed This Encounter  Procedures   Helicobacter pylori special antigen    Standing Status:   Future    Standing Expiration Date:   01/26/2023   Flu Vaccine QUAD 28mo+IM (Fluarix, Fluzone & Alfiuria Quad PF)   Comprehensive metabolic panel   CBC with Differential/Platelet   Lipase     Patient Instructions  Flu shot today  We will request records from colonoscopy from [facility] (Dr Evelina Bucy).  Labs today Do stool test after being off nexium for 2 full weeks.  Ok to continue bentyl as needed.   Follow up plan: Return if symptoms worsen or fail to improve.  Ria Bush, MD

## 2022-01-25 NOTE — Patient Instructions (Addendum)
Flu shot today  We will request records from colonoscopy from [facility] (Dr Evelina Bucy).  Labs today Do stool test after being off nexium for 2 full weeks.  Ok to continue bentyl as needed.

## 2022-01-26 LAB — CBC WITH DIFFERENTIAL/PLATELET
Basophils Absolute: 0.1 10*3/uL (ref 0.0–0.1)
Basophils Relative: 0.6 % (ref 0.0–3.0)
Eosinophils Absolute: 0.3 10*3/uL (ref 0.0–0.7)
Eosinophils Relative: 2.4 % (ref 0.0–5.0)
HCT: 42.1 % (ref 39.0–52.0)
Hemoglobin: 14.3 g/dL (ref 13.0–17.0)
Lymphocytes Relative: 18.8 % (ref 12.0–46.0)
Lymphs Abs: 2.2 10*3/uL (ref 0.7–4.0)
MCHC: 34.1 g/dL (ref 30.0–36.0)
MCV: 87.6 fl (ref 78.0–100.0)
Monocytes Absolute: 0.8 10*3/uL (ref 0.1–1.0)
Monocytes Relative: 7 % (ref 3.0–12.0)
Neutro Abs: 8.2 10*3/uL — ABNORMAL HIGH (ref 1.4–7.7)
Neutrophils Relative %: 71.2 % (ref 43.0–77.0)
Platelets: 263 10*3/uL (ref 150.0–400.0)
RBC: 4.81 Mil/uL (ref 4.22–5.81)
RDW: 12.6 % (ref 11.5–15.5)
WBC: 11.5 10*3/uL — ABNORMAL HIGH (ref 4.0–10.5)

## 2022-01-26 LAB — LIPASE: Lipase: 12 U/L (ref 11.0–59.0)

## 2022-01-26 LAB — COMPREHENSIVE METABOLIC PANEL
ALT: 21 U/L (ref 0–53)
AST: 26 U/L (ref 0–37)
Albumin: 4.8 g/dL (ref 3.5–5.2)
Alkaline Phosphatase: 50 U/L (ref 39–117)
BUN: 26 mg/dL — ABNORMAL HIGH (ref 6–23)
CO2: 27 mEq/L (ref 19–32)
Calcium: 9.4 mg/dL (ref 8.4–10.5)
Chloride: 103 mEq/L (ref 96–112)
Creatinine, Ser: 1.21 mg/dL (ref 0.40–1.50)
GFR: 72.14 mL/min (ref >60.00)
Glucose, Bld: 90 mg/dL (ref 70–99)
Potassium: 3.9 mEq/L (ref 3.5–5.1)
Sodium: 138 mEq/L (ref 135–145)
Total Bilirubin: 1.2 mg/dL (ref 0.2–1.2)
Total Protein: 7.5 g/dL (ref 6.0–8.3)

## 2022-01-27 ENCOUNTER — Encounter: Payer: Self-pay | Admitting: Family Medicine

## 2022-01-27 NOTE — Assessment & Plan Note (Addendum)
Describes intermittent sharp upper abdominal/RUQ discomfort that did seem to improve with food intake, but not associated with reflux/heartburn symptoms. Pain has currently resolved. Reviewed workup from 04/2021 including labwork and abdominal US - overall reassuring. He also had remote endoscopy 2009 showing hiatal hernia and GERD.  Overall reassuring exam today. ?IBS, gallstone, GERD, gastritis, pancreatitis. Did seem to improve with nexium and dropping caffeine/alcohol use. Will further evaluate with labwork given location of discomfort. Will also check H pylori Ag after being off nexium for 2 wks.  Not consistent with lactose or other food intolerance or allergy.  Discussed pepcid use.

## 2022-07-29 ENCOUNTER — Encounter: Payer: Self-pay | Admitting: Family Medicine

## 2022-07-29 ENCOUNTER — Ambulatory Visit (INDEPENDENT_AMBULATORY_CARE_PROVIDER_SITE_OTHER): Payer: Commercial Managed Care - PPO | Admitting: Family Medicine

## 2022-07-29 VITALS — BP 136/84 | HR 83 | Temp 97.6°F | Ht 70.5 in | Wt 219.1 lb

## 2022-07-29 DIAGNOSIS — R109 Unspecified abdominal pain: Secondary | ICD-10-CM | POA: Diagnosis not present

## 2022-07-29 LAB — CBC WITH DIFFERENTIAL/PLATELET
Basophils Absolute: 0 10*3/uL (ref 0.0–0.1)
Basophils Relative: 0.4 % (ref 0.0–3.0)
Eosinophils Absolute: 0.2 10*3/uL (ref 0.0–0.7)
Eosinophils Relative: 2 % (ref 0.0–5.0)
HCT: 43.3 % (ref 39.0–52.0)
Hemoglobin: 15 g/dL (ref 13.0–17.0)
Lymphocytes Relative: 18.9 % (ref 12.0–46.0)
Lymphs Abs: 1.7 10*3/uL (ref 0.7–4.0)
MCHC: 34.5 g/dL (ref 30.0–36.0)
MCV: 86.6 fl (ref 78.0–100.0)
Monocytes Absolute: 0.7 10*3/uL (ref 0.1–1.0)
Monocytes Relative: 7.4 % (ref 3.0–12.0)
Neutro Abs: 6.5 10*3/uL (ref 1.4–7.7)
Neutrophils Relative %: 71.3 % (ref 43.0–77.0)
Platelets: 263 10*3/uL (ref 150.0–400.0)
RBC: 5 Mil/uL (ref 4.22–5.81)
RDW: 13.9 % (ref 11.5–15.5)
WBC: 9.2 10*3/uL (ref 4.0–10.5)

## 2022-07-29 LAB — COMPREHENSIVE METABOLIC PANEL
ALT: 24 U/L (ref 0–53)
AST: 21 U/L (ref 0–37)
Albumin: 4.8 g/dL (ref 3.5–5.2)
Alkaline Phosphatase: 50 U/L (ref 39–117)
BUN: 19 mg/dL (ref 6–23)
CO2: 29 mEq/L (ref 19–32)
Calcium: 9.6 mg/dL (ref 8.4–10.5)
Chloride: 100 mEq/L (ref 96–112)
Creatinine, Ser: 1.13 mg/dL (ref 0.40–1.50)
GFR: 78.04 mL/min (ref >60.00)
Glucose, Bld: 77 mg/dL (ref 70–99)
Potassium: 4 mEq/L (ref 3.5–5.1)
Sodium: 138 mEq/L (ref 135–145)
Total Bilirubin: 1.2 mg/dL (ref 0.2–1.2)
Total Protein: 7.6 g/dL (ref 6.0–8.3)

## 2022-07-29 MED ORDER — AMOXICILLIN-POT CLAVULANATE 875-125 MG PO TABS: 1.0000 | ORAL_TABLET | Freq: Two times a day (BID) | ORAL | 0 refills | Status: AC

## 2022-07-29 NOTE — Progress Notes (Signed)
Ph: [phone]       Fax: [phone]   Patient ID: Anthonee Gelin, male    DOB: Aug 12, 1975, 47 y.o.   MRN: [id number]  This visit was conducted in person.  BP 136/84   Pulse 83   Temp 97.6 F (36.4 C) (Temporal)   Ht 5' 10.5" (1.791 m)   Wt 219 lb 2 oz (99.4 kg)   SpO2 93%   BMI 31.00 kg/m    CC: abd pain  Subjective:   HPI: Siddhant Hashemi is a 47 y.o. male presenting on 07/29/2022 for Abdominal Pain (C/o abd pain in umbilical region, bloating and diarrhea. Denies any nausea/vomiting. Sxs started about 2 wks ago. Sxs have mildly improved. H/o same sxs about 1 yr ago. Tried OTC probiotics and Metamucil. )   2 wk h/o periumbilical R of midline abdominal discomfort associated with significant bloating and gassiness. Had loose stools yesterday. Symptom are improving.  Prior to these symptoms starting he was eating more nuts and dried fruits than normal.  He restarted probiotic with benefit. He started turmeric and metamucil gummies for fiber.  Stools are smoother since starting metamucil.  No other new foods, no new restaurants.  Noted some heartburn - took a few nexium 2-3 weeks ago.   No fevers/chills, nausea/vomiting, upper abd discomfort, significant bowel changes of diarrhea, constipation, blood in stool or urine, UTI symptoms.  No acholic stool, no increased buoyancy of stools.   Similar symptoms 04/2021 and again 01/2022 - workup with normal labs including lipase and normal abdominal US. Previously nexium was helpful. Bentyl didn't really help.   He saw GI Dr Bethanie Dicker at [facility] in [place] 05/2021 - with reassuring evaluation including colonoscopy mid 2023 - clear, rec return 10 yrs. Will request records.   Abd [place] 05/2021 - WNL, no gallstones, normal liver, pancreas, spleen, kidneys.  EGD 2009 - HH, GERD   At age 9 yo had bad intestinal infection hospitalized treated with cipro, didn't feel well on this.      Relevant past medical, surgical,  family and social history reviewed and updated as indicated. Interim medical history since our last visit reviewed. Allergies and medications reviewed and updated. Outpatient Medications Prior to Visit  Medication Sig Dispense Refill   Multiple Vitamins-Minerals (MULTIVITAMIN & MINERAL PO) Take 1 tablet by mouth daily. Reported on 03/19/2015     dicyclomine (BENTYL) 20 MG tablet Take 1 tablet (20 mg total) by mouth 4 (four) times daily as needed for spasms. (Patient not taking: Reported on 07/29/2022) 40 tablet 0   No facility-administered medications prior to visit.     Per HPI unless specifically indicated in ROS section below Review of Systems  Objective:  BP 136/84   Pulse 83   Temp 97.6 F (36.4 C) (Temporal)   Ht 5' 10.5" (1.791 m)   Wt 219 lb 2 oz (99.4 kg)   SpO2 93%   BMI 31.00 kg/m   Wt Readings from Last 3 Encounters:  07/29/22 219 lb 2 oz (99.4 kg)  01/25/22 215 lb 4 oz (97.6 kg)  04/23/21 210 lb 5 oz (95.4 kg)      Physical Exam Vitals and nursing note reviewed.  Constitutional:      Appearance: Normal appearance. He is not ill-appearing.  HENT:     Head: Normocephalic and atraumatic.     Mouth/Throat:     Mouth: Mucous membranes are moist.     Pharynx: Oropharynx is clear. No oropharyngeal exudate or posterior  oropharyngeal erythema.  Eyes:     Extraocular Movements: Extraocular movements intact.     Pupils: Pupils are equal, round, and reactive to light.  Cardiovascular:     Rate and Rhythm: Normal rate and regular rhythm.     Pulses: Normal pulses.     Heart sounds: Normal heart sounds. No murmur heard. Pulmonary:     Effort: Pulmonary effort is normal. No respiratory distress.     Breath sounds: Normal breath sounds. No wheezing, rhonchi or rales.  Abdominal:     General: Bowel sounds are normal. There is no distension or abdominal bruit.     Palpations: Abdomen is soft. There is no hepatomegaly, splenomegaly or mass.     Tenderness: There is  abdominal tenderness (mild) in the left lower quadrant. There is no guarding or rebound.     Hernia: No hernia is present.  Musculoskeletal:     Right lower leg: No edema.     Left lower leg: No edema.  Skin:    General: Skin is warm and dry.     Findings: No rash.  Neurological:     Mental Status: He is alert.  Psychiatric:        Mood and Affect: Mood normal.        Behavior: Behavior normal.       Results for orders placed or performed in visit on 01/25/22  Comprehensive metabolic panel  Result Value Ref Range   Sodium 138 135 - 145 mEq/L   Potassium 3.9 3.5 - 5.1 mEq/L   Chloride 103 96 - 112 mEq/L   CO2 27 19 - 32 mEq/L   Glucose, Bld 90 70 - 99 mg/dL   BUN 26 (H) 6 - 23 mg/dL   Creatinine, Ser 1.61 0.40 - 1.50 mg/dL   Total Bilirubin 1.2 0.2 - 1.2 mg/dL   Alkaline Phosphatase 50 39 - 117 U/L   AST 26 0 - 37 U/L   ALT 21 0 - 53 U/L   Total Protein 7.5 6.0 - 8.3 g/dL   Albumin 4.8 3.5 - 5.2 g/dL   GFR 09.60 >45.40 mL/min   Calcium 9.4 8.4 - 10.5 mg/dL  CBC with Differential/Platelet  Result Value Ref Range   WBC 11.5 (H) 4.0 - 10.5 K/uL   RBC 4.81 4.22 - 5.81 Mil/uL   Hemoglobin 14.3 13.0 - 17.0 g/dL   HCT 98.1 19.1 - 47.8 %   MCV 87.6 78.0 - 100.0 fl   MCHC 34.1 30.0 - 36.0 g/dL   RDW 29.5 62.1 - 30.8 %   Platelets 263.0 150.0 - 400.0 K/uL   Neutrophils Relative % 71.2 43.0 - 77.0 %   Lymphocytes Relative 18.8 12.0 - 46.0 %   Monocytes Relative 7.0 3.0 - 12.0 %   Eosinophils Relative 2.4 0.0 - 5.0 %   Basophils Relative 0.6 0.0 - 3.0 %   Neutro Abs 8.2 (H) 1.4 - 7.7 K/uL   Lymphs Abs 2.2 0.7 - 4.0 K/uL   Monocytes Absolute 0.8 0.1 - 1.0 K/uL   Eosinophils Absolute 0.3 0.0 - 0.7 K/uL   Basophils Absolute 0.1 0.0 - 0.1 K/uL  Lipase  Result Value Ref Range   Lipase 12.0 11.0 - 59.0 U/L    Assessment & Plan:   Problem List Items Addressed This Visit     Abdominal discomfort - Primary    Periumbilical discomfort associated with gassiness/bloating, with  LLQ pain to palpation on exam today. ?diverticulitis - rec clear liquid diet x1-2 days,  WASP for augmentin provided today. Update if not improved with treatment to consider imaging.  Discussed Gas X use, probiotic use, soluble fiber supplement use.  Check CBC, CMP today.  Will request latest colonoscopy/GI eval last year.       Relevant Orders   CBC with Differential/Platelet   Comprehensive metabolic panel     Meds ordered this encounter  Medications   amoxicillin-clavulanate (AUGMENTIN) 875-125 MG tablet    Sig: Take 1 tablet by mouth 2 (two) times daily for 10 days.    Dispense:  20 tablet    Refill:  0    Orders Placed This Encounter  Procedures   CBC with Differential/Platelet   Comprehensive metabolic panel    Patient Instructions  For fiber - citrucel supplement over metamucil.  Try gas X (simethicone).  Repeat labs today  ?Diverticulitis - do clear liquid diet for 24 hours then slowly advance as tolerated.  If not improved may take antibiotic augmentin printed out today.  Let us know if not improving with this treatment.  We will request records of recent colonoscopy to review.   Probiotic - look for one with 3+ billion CFU's.   Follow up plan: Return if symptoms worsen or fail to improve.  Eustaquio Boyden, MD

## 2022-07-29 NOTE — Assessment & Plan Note (Signed)
Periumbilical discomfort associated with gassiness/bloating, with LLQ pain to palpation on exam today. ?diverticulitis - rec clear liquid diet x1-2 days, WASP for augmentin provided today. Update if not improved with treatment to consider imaging.  Discussed Gas X use, probiotic use, soluble fiber supplement use.  Check CBC, CMP today.  Will request latest colonoscopy/GI eval last year.

## 2022-07-29 NOTE — Patient Instructions (Addendum)
For fiber - citrucel supplement over metamucil.  Try gas X (simethicone).  Repeat labs today  ?Diverticulitis - do clear liquid diet for 24 hours then slowly advance as tolerated.  If not improved may take antibiotic augmentin printed out today.  Let us know if not improving with this treatment.  We will request records of recent colonoscopy to review.   Probiotic - look for one with 3+ billion CFU's.

## 2022-08-02 ENCOUNTER — Other Ambulatory Visit: Payer: Self-pay | Admitting: Surgery

## 2022-08-03 ENCOUNTER — Ambulatory Visit: Payer: Commercial Managed Care - PPO | Admitting: Anesthesiology

## 2022-08-03 ENCOUNTER — Other Ambulatory Visit: Payer: Self-pay

## 2022-08-03 ENCOUNTER — Encounter: Payer: Self-pay | Admitting: Surgery

## 2022-08-03 ENCOUNTER — Ambulatory Visit
Admission: RE | Admit: 2022-08-03 | Discharge: 2022-08-03 | Disposition: A | Discharge status: Home / Self Care | Payer: Commercial Managed Care - PPO | Attending: Surgery | Admitting: Surgery

## 2022-08-03 ENCOUNTER — Encounter
Admission: RE | Disposition: A | Discharge status: Home / Self Care | Payer: Self-pay | Source: Home / Self Care | Attending: Surgery

## 2022-08-03 DIAGNOSIS — Z09 Encounter for follow-up examination after completed treatment for conditions other than malignant neoplasm: Secondary | ICD-10-CM | POA: Diagnosis not present

## 2022-08-03 DIAGNOSIS — Y936A Activity, physical games generally associated with school recess, summer camp and children: Secondary | ICD-10-CM | POA: Diagnosis not present

## 2022-08-03 DIAGNOSIS — Z8616 Personal history of COVID-19: Secondary | ICD-10-CM | POA: Insufficient documentation

## 2022-08-03 DIAGNOSIS — S86012A Strain of left Achilles tendon, initial encounter: Secondary | ICD-10-CM | POA: Diagnosis present

## 2022-08-03 DIAGNOSIS — Z419 Encounter for procedure for purposes other than remedying health state, unspecified: Secondary | ICD-10-CM

## 2022-08-03 DIAGNOSIS — Z87891 Personal history of nicotine dependence: Secondary | ICD-10-CM | POA: Insufficient documentation

## 2022-08-03 HISTORY — PX: ACHILLES TENDON SURGERY: SHX542

## 2022-08-03 SURGERY — REPAIR, TENDON, ACHILLES
Anesthesia: General | Site: Ankle | Laterality: Left

## 2022-08-03 MED ORDER — BUPIVACAINE HCL 0.5 % IJ SOLN
INTRAMUSCULAR | Status: DC | PRN
  Administered 2022-08-03: 20 mL

## 2022-08-03 MED ORDER — DEXAMETHASONE SODIUM PHOSPHATE 10 MG/ML IJ SOLN
INTRAMUSCULAR | Status: DC | PRN
  Administered 2022-08-03: 10 mg via INTRAVENOUS

## 2022-08-03 MED ORDER — ACETAMINOPHEN 325 MG PO TABS: 325.0000 mg | ORAL_TABLET | Freq: Four times a day (QID) | ORAL | Status: DC | PRN

## 2022-08-03 MED ORDER — GLYCOPYRROLATE 0.2 MG/ML IJ SOLN
INTRAMUSCULAR | Status: DC | PRN
  Administered 2022-08-03: .2 mg via INTRAVENOUS

## 2022-08-03 MED ORDER — METOCLOPRAMIDE HCL 5 MG/ML IJ SOLN: 5.0000 mg | Freq: Three times a day (TID) | INTRAMUSCULAR | Status: DC | PRN

## 2022-08-03 MED ORDER — MIDAZOLAM HCL 2 MG/2ML IJ SOLN
INTRAMUSCULAR | Status: DC | PRN
  Administered 2022-08-03: 2 mg via INTRAVENOUS

## 2022-08-03 MED ORDER — ACETAMINOPHEN 10 MG/ML IV SOLN
INTRAVENOUS | Status: AC
  Filled 2022-08-03: qty 100

## 2022-08-03 MED ORDER — CEFAZOLIN SODIUM-DEXTROSE 2-4 GM/100ML-% IV SOLN
2.0000 g | INTRAVENOUS | Status: AC
  Administered 2022-08-03: 2 g via INTRAVENOUS

## 2022-08-03 MED ORDER — FENTANYL CITRATE (PF) 100 MCG/2ML IJ SOLN
INTRAMUSCULAR | Status: AC
  Filled 2022-08-03: qty 2

## 2022-08-03 MED ORDER — ACETAMINOPHEN 10 MG/ML IV SOLN
INTRAVENOUS | Status: DC | PRN
  Administered 2022-08-03: 1000 mg via INTRAVENOUS

## 2022-08-03 MED ORDER — SODIUM CHLORIDE 0.9 % IV SOLN: INTRAVENOUS | Status: DC

## 2022-08-03 MED ORDER — KETOROLAC TROMETHAMINE 30 MG/ML IJ SOLN
INTRAMUSCULAR | Status: AC
  Filled 2022-08-03: qty 1

## 2022-08-03 MED ORDER — FENTANYL CITRATE (PF) 100 MCG/2ML IJ SOLN
INTRAMUSCULAR | Status: DC | PRN
  Administered 2022-08-03 (×2): 50 ug via INTRAVENOUS

## 2022-08-03 MED ORDER — CHLORHEXIDINE GLUCONATE 0.12 % MT SOLN
15.0000 mL | Freq: Once | OROMUCOSAL | Status: AC
  Administered 2022-08-03: 15 mL via OROMUCOSAL

## 2022-08-03 MED ORDER — ONDANSETRON HCL 4 MG/2ML IJ SOLN
INTRAMUSCULAR | Status: DC | PRN
  Administered 2022-08-03 (×2): 4 mg via INTRAVENOUS

## 2022-08-03 MED ORDER — DEXMEDETOMIDINE HCL IN NACL 80 MCG/20ML IV SOLN
INTRAVENOUS | Status: DC | PRN
  Administered 2022-08-03: 4 ug via INTRAVENOUS

## 2022-08-03 MED ORDER — ONDANSETRON HCL 4 MG/2ML IJ SOLN: 4.0000 mg | Freq: Four times a day (QID) | INTRAMUSCULAR | Status: DC | PRN

## 2022-08-03 MED ORDER — DROPERIDOL 2.5 MG/ML IJ SOLN: 0.6250 mg | Freq: Once | INTRAMUSCULAR | Status: DC | PRN

## 2022-08-03 MED ORDER — ACETAMINOPHEN 10 MG/ML IV SOLN: 1000.0000 mg | Freq: Once | INTRAVENOUS | Status: DC | PRN

## 2022-08-03 MED ORDER — HYDROMORPHONE HCL 1 MG/ML IJ SOLN
INTRAMUSCULAR | Status: DC | PRN
  Administered 2022-08-03 (×2): .5 mg via INTRAVENOUS

## 2022-08-03 MED ORDER — OXYCODONE HCL 5 MG PO TABS: 5.0000 mg | ORAL_TABLET | Freq: Once | ORAL | Status: DC | PRN

## 2022-08-03 MED ORDER — CEFAZOLIN SODIUM-DEXTROSE 2-4 GM/100ML-% IV SOLN
INTRAVENOUS | Status: AC
  Filled 2022-08-03: qty 100

## 2022-08-03 MED ORDER — LACTATED RINGERS IV SOLN: INTRAVENOUS | Status: DC

## 2022-08-03 MED ORDER — KETOROLAC TROMETHAMINE 30 MG/ML IJ SOLN
30.0000 mg | Freq: Once | INTRAMUSCULAR | Status: AC
  Administered 2022-08-03: 30 mg via INTRAVENOUS

## 2022-08-03 MED ORDER — OXYCODONE HCL 5 MG/5ML PO SOLN: 5.0000 mg | Freq: Once | ORAL | Status: DC | PRN

## 2022-08-03 MED ORDER — OXYCODONE HCL 5 MG PO TABS: 5.0000 mg | ORAL_TABLET | ORAL | Status: DC | PRN

## 2022-08-03 MED ORDER — 0.9 % SODIUM CHLORIDE (POUR BTL) OPTIME
TOPICAL | Status: DC | PRN
  Administered 2022-08-03: 250 mL

## 2022-08-03 MED ORDER — PROPOFOL 10 MG/ML IV BOLUS
INTRAVENOUS | Status: DC | PRN
  Administered 2022-08-03: 200 mg via INTRAVENOUS

## 2022-08-03 MED ORDER — BUPIVACAINE HCL (PF) 0.5 % IJ SOLN
INTRAMUSCULAR | Status: AC
  Filled 2022-08-03: qty 30

## 2022-08-03 MED ORDER — LIDOCAINE HCL (CARDIAC) PF 100 MG/5ML IV SOSY
PREFILLED_SYRINGE | INTRAVENOUS | Status: DC | PRN
  Administered 2022-08-03: 100 mg via INTRAVENOUS

## 2022-08-03 MED ORDER — METOCLOPRAMIDE HCL 10 MG PO TABS: 5.0000 mg | ORAL_TABLET | Freq: Three times a day (TID) | ORAL | Status: DC | PRN

## 2022-08-03 MED ORDER — MIDAZOLAM HCL 2 MG/2ML IJ SOLN
INTRAMUSCULAR | Status: AC
  Filled 2022-08-03: qty 2

## 2022-08-03 MED ORDER — SUGAMMADEX SODIUM 200 MG/2ML IV SOLN
INTRAVENOUS | Status: DC | PRN
  Administered 2022-08-03: 200 mg via INTRAVENOUS

## 2022-08-03 MED ORDER — LIDOCAINE HCL (PF) 1 % IJ SOLN
INTRAMUSCULAR | Status: AC
  Filled 2022-08-03: qty 30

## 2022-08-03 MED ORDER — ORAL CARE MOUTH RINSE: 15.0000 mL | Freq: Once | OROMUCOSAL | Status: AC

## 2022-08-03 MED ORDER — ONDANSETRON HCL 4 MG PO TABS: 4.0000 mg | ORAL_TABLET | Freq: Four times a day (QID) | ORAL | Status: DC | PRN

## 2022-08-03 MED ORDER — CHLORHEXIDINE GLUCONATE 0.12 % MT SOLN
OROMUCOSAL | Status: AC
  Filled 2022-08-03: qty 15

## 2022-08-03 MED ORDER — HYDROMORPHONE HCL 1 MG/ML IJ SOLN
INTRAMUSCULAR | Status: AC
  Filled 2022-08-03: qty 1

## 2022-08-03 MED ORDER — ROCURONIUM BROMIDE 100 MG/10ML IV SOLN
INTRAVENOUS | Status: DC | PRN
  Administered 2022-08-03: 50 mg via INTRAVENOUS

## 2022-08-03 MED ORDER — PROMETHAZINE HCL 25 MG/ML IJ SOLN: 6.2500 mg | INTRAMUSCULAR | Status: DC | PRN

## 2022-08-03 MED ORDER — FENTANYL CITRATE (PF) 100 MCG/2ML IJ SOLN: 25.0000 ug | INTRAMUSCULAR | Status: DC | PRN

## 2022-08-03 SURGICAL SUPPLY — 57 items
BLADE SURG 10 STRL SS (BLADE) IMPLANT
BNDG COHESIVE 4X5 TAN STRL LF (GAUZE/BANDAGES/DRESSINGS) ×1 IMPLANT
BNDG ELASTIC 4INX 5YD STR LF (GAUZE/BANDAGES/DRESSINGS) ×1 IMPLANT
BNDG ELASTIC 6INX 5YD STR LF (GAUZE/BANDAGES/DRESSINGS) ×2 IMPLANT
BNDG ESMARCH 6 X 12 STRL LF (GAUZE/BANDAGES/DRESSINGS) ×1
BNDG ESMARCH 6X12 STRL LF (GAUZE/BANDAGES/DRESSINGS) ×1 IMPLANT
CHLORAPREP W/TINT 26 (MISCELLANEOUS) ×1 IMPLANT
CUFF TOURN SGL QUICK 24 (TOURNIQUET CUFF)
CUFF TOURN SGL QUICK 34 (TOURNIQUET CUFF)
CUFF TRNQT CYL 24X4X16.5-23 (TOURNIQUET CUFF) IMPLANT
CUFF TRNQT CYL 34X4.125X (TOURNIQUET CUFF) IMPLANT
DRAPE INCISE IOBAN 66X45 STRL (DRAPES) ×1 IMPLANT
ELECT CAUTERY BLADE 6.4 (BLADE) ×1 IMPLANT
ELECT REM PT RETURN 9FT ADLT (ELECTROSURGICAL) ×1
ELECTRODE REM PT RTRN 9FT ADLT (ELECTROSURGICAL) ×1 IMPLANT
GAUZE SPONGE 4X4 12PLY STRL (GAUZE/BANDAGES/DRESSINGS) ×1 IMPLANT
GAUZE XEROFORM 1X8 LF (GAUZE/BANDAGES/DRESSINGS) ×1 IMPLANT
GLOVE BIO SURGEON STRL SZ8 (GLOVE) ×2 IMPLANT
GLOVE INDICATOR 8.0 STRL GRN (GLOVE) ×1 IMPLANT
GOWN STRL REUS W/ TWL LRG LVL3 (GOWN DISPOSABLE) ×1 IMPLANT
GOWN STRL REUS W/ TWL XL LVL3 (GOWN DISPOSABLE) ×1 IMPLANT
GOWN STRL REUS W/TWL LRG LVL3 (GOWN DISPOSABLE) ×1
GOWN STRL REUS W/TWL XL LVL3 (GOWN DISPOSABLE) ×1
KIT TURNOVER KIT A (KITS) ×1 IMPLANT
MANIFOLD NEPTUNE II (INSTRUMENTS) ×1 IMPLANT
NDL MAYO 6 CRC TAPER PT (NEEDLE) IMPLANT
NEEDLE MAYO 6 CRC TAPER PT (NEEDLE) ×1 IMPLANT
NS IRRIG 1000ML POUR BTL (IV SOLUTION) ×1 IMPLANT
PAD ABD DERMACEA PRESS 5X9 (GAUZE/BANDAGES/DRESSINGS) ×2 IMPLANT
PAD CAST 4YDX4 CTTN HI CHSV (CAST SUPPLIES) ×3 IMPLANT
PAD CAST CTTN 4X4 STRL (SOFTGOODS) IMPLANT
PADDING CAST COTTON 4X4 STRL (CAST SUPPLIES) ×2
PADDING CAST COTTON 4X4 STRL (SOFTGOODS) ×1
SPONGE T-LAP 18X18 ~~LOC~~+RFID (SPONGE) ×1 IMPLANT
STOCKINETTE M/LG 89821 (MISCELLANEOUS) ×1 IMPLANT
STRAP SAFETY 5IN WIDE (MISCELLANEOUS) ×1 IMPLANT
STRIP CLOSURE SKIN 1/4X4 (GAUZE/BANDAGES/DRESSINGS) IMPLANT
SUT ETHIBOND 5-0 MS/4 CCS GRN (SUTURE)
SUT FIBERWIRE #2 38 BLUE 1/2 (SUTURE)
SUT FIBERWIRE #2 38 T-5 BLUE (SUTURE) ×1
SUT FIBERWIRE #5 38 CONV BLUE (SUTURE) ×1
SUT PROLENE 4 0 PS 2 18 (SUTURE) IMPLANT
SUT TICRON 2-0 30IN 311381 (SUTURE) IMPLANT
SUT VIC AB 0 CT1 36 (SUTURE) IMPLANT
SUT VIC AB 1 CT1 36 (SUTURE) IMPLANT
SUT VIC AB 2-0 CT2 27 (SUTURE) IMPLANT
SUT VIC AB 2-0 SH 27 (SUTURE) ×1
SUT VIC AB 2-0 SH 27XBRD (SUTURE) ×1 IMPLANT
SUT VIC AB 3-0 SH 27 (SUTURE) ×2
SUT VIC AB 3-0 SH 27X BRD (SUTURE) IMPLANT
SUTURE ETHBND 5-0 MS/4 CCS GRN (SUTURE) IMPLANT
SUTURE FIBERWR #2 38 BLUE 1/2 (SUTURE) IMPLANT
SUTURE FIBERWR #2 38 T-5 BLUE (SUTURE) IMPLANT
SUTURE FIBERWR #5 38 CONV BLUE (SUTURE) IMPLANT
SWABSTK COMLB BENZOIN TINCTURE (MISCELLANEOUS) IMPLANT
TRAP FLUID SMOKE EVACUATOR (MISCELLANEOUS) ×1 IMPLANT
WATER STERILE IRR 500ML POUR (IV SOLUTION) ×1 IMPLANT

## 2022-08-03 NOTE — H&P (Signed)
History of Present Illness: Shaun Phillips is a 47 y.o. male who presents today for evaluation of left posterior ankle pain. Yesterday, patient was playing kickball, kicked the ball and took off running and immediately went to the ground. Felt pain and swelling behind seen in urgent care placed into a boot and given crutches. He comes in today for follow-up. He denies mid Achilles tendon. He is unable to plantarflex. He is able to dorsiflex. Patient is very active. He enjoys playing soccer kickball and exercising.   Past Medical History: No past medical history on file.  Past Surgical History: Status post correction of talar coalition left foot about 25 years ago  Past Family History: No family history on file.  Medications: Cyclobenzaprine (FLEXERIL) 10 MG tablet  HYDROcodone-acetaminophen (NORCO) 5-325 mg tablet   Allergies: Ciprofloxacin   Review of Systems:  A comprehensive 14 point ROS was performed, reviewed by me today, and the pertinent orthopaedic findings are documented in the HPI.  Physical Exam: BP (!) 140/82  Ht 177.8 cm (5\' 10" )  Wt 99.3 kg (219 lb)  BMI 31.42 kg/m   General:  Well developed, well nourished, no apparent distress, normal affect, ambulates with crutches and a boot. Nonweightbearing left lower extremity  HEENT: Head normocephalic, atraumatic, PERRL.   Abdomen: Soft, non tender, non distended, Bowel sounds present.  Heart: Examination of the heart reveals regular, rate, and rhythm. There is no murmur noted on ascultation. There is a normal apical pulse.  Lungs: Lungs are clear to auscultation. There is no wheeze, rhonchi, or crackles. There is normal expansion of bilateral chest walls.   Left ankle: Mild swelling along the Achilles region, patient is tender to palpation. Patient is unable to actively plantarflex the ankle. No plantarflexion noted with squeezing of the calf in a prone position. There is no skin breakdown noted. 2+ dorsalis pedis  pulses.  X-Rays: AP lateral and oblique views of the left ankle are ordered interpreted by me in the office today. Impression: Patient has no evidence of acute bony abnormality. 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 No significant arthropathy throughout the tibiotalar joint. Anterior spurring along the tarsals  Impression: Achilles tendon tear, left.  Plan:  47 year old male with acute injury to the left ankle yesterday. History and exam as well as ultrasound findings all showing complete tear of the Achilles tendon. Risks, benefits, complications of a left Achilles tendon repair have been discussed with the patient. Patient has agreed and consented to the procedure with Dr. Joice Lofts 08/03/2022. Patient will continue nonweightbearing with crutches and walking boot, his pain is well-controlled.    H&P reviewed and patient re-examined. No changes.

## 2022-08-03 NOTE — Transfer of Care (Signed)
Immediate Anesthesia Transfer of Care Note  Patient: Gerrod Maule  Procedure(s) Performed: ACHILLES TENDON REPAIR (Left: Ankle)  Patient Location: PACU  Anesthesia Type:General  Level of Consciousness: awake and alert   Airway & Oxygen Therapy: Patient Spontanous Breathing and Patient connected to face mask oxygen  Post-op Assessment: Report given to RN and Post -op Vital signs reviewed and stable  Post vital signs: Reviewed and stable  Last Vitals:  Vitals Value Taken Time  BP 132/81   Temp    Pulse 89 08/03/22 1713  Resp 10   SpO2 98 % 08/03/22 1713  Vitals shown include unvalidated device data.  Last Pain:  Vitals:   08/03/22 1230  TempSrc: Temporal  PainSc: 0-No pain         Complications: No notable events documented.

## 2022-08-03 NOTE — Discharge Instructions (Addendum)
Orthopedic discharge instructions: Keep splint dry and intact. Keep left leg elevated above heart level. Apply ice frequently to ankle. Take pain medication as prescribed when needed.  May supplement with ES Tylenol if necessary. No weightbearing on left leg - use walker or crutches for ambulation. Follow-up in 10-14 days or as scheduled.  AMBULATORY SURGERY  DISCHARGE INSTRUCTIONS   The drugs that you were given will stay in your system until tomorrow so for the next 24 hours you should not:  Drive an automobile Make any legal decisions Drink any alcoholic beverage   You may resume regular meals tomorrow.  Today it is better to start with liquids and gradually work up to solid foods.  You may eat anything you prefer, but it is better to start with liquids, then soup and crackers, and gradually work up to solid foods.   Please notify your doctor immediately if you have any unusual bleeding, trouble breathing, redness and pain at the surgery site, drainage, fever, or pain not relieved by medication.      Additional Instructions:

## 2022-08-03 NOTE — Op Note (Signed)
08/03/2022  5:09 PM  Patient:   Shaun Phillips  Pre-Op Diagnosis:   Acute left Achilles tendon rupture.  Post-Op Diagnosis:   Same.  Procedure:   Primary repair of acute left Achilles tendon rupture.  Surgeon:   Maryagnes Amos, MD  Assistant:   None  Anesthesia:   GET  Findings:   As above.  Complications:   None  EBL:   5 cc  Fluids:   600 cc crystalloid  TT:   65 min. at 300 mmHg  Drains:   None  Closure:   Staples  Brief Clinical Note:   The patient is a 47 year old male who sustained the above-noted injury 2 days ago while playing kickball. He was seen in at a local urgent care facility where he was diagnosed with an Achilles tendon rupture, placed in a short cam walker boot, and advised to follow-up with orthopedics. He was seen in the office yesterday where a left Achilles tendon rupture was confirmed by ultrasound. The patient presents at this time for a primary repair of the left Achilles tendon rupture.  Procedure:   The patient was brought into the operating room and lain in the supine position. After adequate general endotracheal intubation and anesthesia were obtained, the patient was rolled into the prone position on the operating table. Care was taken to be sure the chest pads were well positioned and that his knees were well padded. The left lower extremity and foot was prepped with ChloraPrep solution before being draped sterilely. Preoperative antibiotics were administered. A timeout was performed to verify the appropriate surgical site before the limb was exsanguinated with an Esmarch and the tourniquet inflated to 300 mmHg.    An approximately 8-9 cm incision, incorporating his prior surgical incision, was made along the postero-medial aspect of the Achilles tendon centered over the palpated defect. The incision was carried down through the subcutaneous tissues to expose the tendon sheath. This was incised the length of the incision to expose the tendon rupture  site. The tendon tissue was noted to be quite attenuated. A #5 FiberWire was woven through the proximal tendon stump in a Bunnell type fashion. A second #5 FiberWire was woven through the distal tendon stump similarly. A strong pull was applied to each suture to be sure that they were well seated and secure. With the ankle plantar flexed to approximately 20, the sutures were tied together to reapproximate the Achilles tendon. An additional #2 FiberWire was woven back and forth through the proximal and distal stumps to further reinforce this repair. The tendon repair appeared stable to 10 of plantar flexion.  The wound was copiously irrigated with sterile saline solution before the tendon sheath was carefully reapproximated using 2-0 Vicryl interrupted sutures. The subcutaneous tissues were closed using 3-0 Vicryl interrupted sutures before the skin was closed using staples. A total of 20 cc of 0.5% plain Sensorcaine was injected in and around the incision site to help with postoperative analgesia before a sterile bulky dressing was applied to the wound. The patient was then placed into a posterior splint with a sugar tong subsequent, maintaining the ankle at approximately 10 of plantar flexion. The patient was rolled back into the supine position on his hospital bed before he was awakened, extubated, and returned to the recovery room in satisfactory condition after tolerating the procedure well.

## 2022-08-03 NOTE — Anesthesia Preprocedure Evaluation (Addendum)
Anesthesia Evaluation  Patient identified by MRN, date of birth, ID band Patient awake    Reviewed: Allergy & Precautions, H&P , NPO status , Patient's Chart, lab work & pertinent test results  Airway Mallampati: II  TM Distance: >3 FB Neck ROM: full    Dental no notable dental hx.    Pulmonary former smoker   Pulmonary exam normal        Cardiovascular negative cardio ROS Normal cardiovascular exam     Neuro/Psych  PSYCHIATRIC DISORDERS Anxiety     negative neurological ROS     GI/Hepatic Neg liver ROS,neg GERD  ,,  Endo/Other  negative endocrine ROS    Renal/GU      Musculoskeletal   Abdominal  (+) + obese  Peds  Hematology negative hematology ROS (+)   Anesthesia Other Findings Achilles tendon tear  Past Medical History: No date: Allergy No date: Anxiety No date: Asthma     Comment:  Hx as child 01/2019: COVID-19 virus infection No date: GERD (gastroesophageal reflux disease) No date: Heart murmur     Comment:  Hx as child No date: HLD (hyperlipidemia)  Past Surgical History: 2004: ANKLE SURGERY     Comment:  left 03/09: ESOPHAGOGASTRODUODENOSCOPY     Comment:  HIATAL HERNIA AND GERD 1995: FINGER SURGERY     Comment:  left 2nd finger repair     Reproductive/Obstetrics negative OB ROS                             Anesthesia Physical Anesthesia Plan  ASA: 2  Anesthesia Plan: General ETT   Post-op Pain Management: Regional block*, Ofirmev IV (intra-op)* and Toradol IV (intra-op)*   Induction: Intravenous  PONV Risk Score and Plan: 2 and Ondansetron, Dexamethasone and Midazolam  Airway Management Planned: Oral ETT  Additional Equipment:   Intra-op Plan:   Post-operative Plan: Extubation in OR  Informed Consent: I have reviewed the patients History and Physical, chart, labs and discussed the procedure including the risks, benefits and alternatives for the  proposed anesthesia with the patient or authorized representative who has indicated his/her understanding and acceptance.     Dental Advisory Given  Plan Discussed with: CRNA and Surgeon  Anesthesia Plan Comments: (Consented for post-op prn regional block)        Anesthesia Quick Evaluation

## 2022-08-03 NOTE — Anesthesia Procedure Notes (Signed)
Procedure Name: Intubation Date/Time: 08/03/2022 3:13 PM  Performed by: Mohammed Kindle, CRNAPre-anesthesia Checklist: Patient identified, Emergency Drugs available, Suction available and Patient being monitored Patient Re-evaluated:Patient Re-evaluated prior to induction Oxygen Delivery Method: Circle system utilized Preoxygenation: Pre-oxygenation with 100% oxygen Induction Type: IV induction Ventilation: Mask ventilation without difficulty Laryngoscope Size: McGraph and 3 Grade View: Grade I Tube type: Oral Tube size: 7.0 mm Number of attempts: 1 Airway Equipment and Method: Stylet and Oral airway Placement Confirmation: ETT inserted through vocal cords under direct vision, positive ETCO2, breath sounds checked- equal and bilateral and CO2 detector Secured at: 21 cm Tube secured with: Tape Dental Injury: Teeth and Oropharynx as per pre-operative assessment

## 2022-08-03 NOTE — Anesthesia Postprocedure Evaluation (Signed)
Anesthesia Post Note  Patient: Shaun Phillips  Procedure(s) Performed: ACHILLES TENDON REPAIR (Left: Ankle)  Patient location during evaluation: PACU Anesthesia Type: General Level of consciousness: awake and alert Pain management: pain level controlled Vital Signs Assessment: post-procedure vital signs reviewed and stable Respiratory status: spontaneous breathing, nonlabored ventilation, respiratory function stable and patient connected to nasal cannula oxygen Cardiovascular status: blood pressure returned to baseline and stable Postop Assessment: no apparent nausea or vomiting Anesthetic complications: no   No notable events documented.   Last Vitals:  Vitals:   08/03/22 1715 08/03/22 1730  BP: 127/77 105/87  Pulse: 70 84  Resp: 11 12  Temp:    SpO2: 99% 99%    Last Pain:  Vitals:   08/03/22 1730  TempSrc:   PainSc: 0-No pain                 Louie Boston

## 2022-08-04 ENCOUNTER — Encounter: Payer: Self-pay | Admitting: Surgery

## 2023-02-14 ENCOUNTER — Ambulatory Visit (INDEPENDENT_AMBULATORY_CARE_PROVIDER_SITE_OTHER): Payer: Commercial Managed Care - PPO | Admitting: Family Medicine

## 2023-02-14 ENCOUNTER — Encounter: Payer: Self-pay | Admitting: Family Medicine

## 2023-02-14 VITALS — BP 120/62 | HR 94 | Temp 98.6°F | Ht 70.0 in | Wt 231.0 lb

## 2023-02-14 DIAGNOSIS — H6992 Unspecified Eustachian tube disorder, left ear: Secondary | ICD-10-CM | POA: Diagnosis not present

## 2023-02-14 NOTE — Progress Notes (Signed)
Ph: [phone] Fax: [phone]   Patient ID: Shaun Phillips, male    DOB: [birth year], 47 y.o.   MRN: [id number]  This visit was conducted in person.  BP 120/62   Pulse 94   Temp 98.6 F (37 C) (Oral)   Ht 5\' 10"  (1.778 m)   Wt 231 lb (104.8 kg)   SpO2 94%   BMI 33.15 kg/m    Chief Complaint  Patient presents with   Sinus Problem    In left side and congestion in left ear.     Subjective:   Discussed the use of AI scribe software for clinical note transcription with the patient, who gave verbal consent to proceed.  History of Present Illness   Shaun Phillips presents with persistent left-sided sinus congestion for the past several weeks. He describes the sensation as 'high sinus' and has been unable to clear it despite using decongestants. He denies any green discharge or pain, but reports a feeling of being 'stopped up.' The patient has been traveling frequently by air for work, which may have contributed to the issue.  The congestion began around three weeks ago, initially presenting like a cold with head congestion but no chest involvement. He has been using Flonase and over-the-counter nasal decongestants from [facility], which have provided some relief but not complete resolution. The patient also reports using a neti pot with tap water for nasal irrigation, but this has not significantly improved his symptoms.  The patient has a history of sinus problems and allergies, for which he has been taking Allegra. He denies any recent swimming or diving activities. He also reports no recent fevers, chills, sore throat, or tooth pain.          Relevant past medical, surgical, family and social history reviewed and updated as indicated. Interim medical history since our last visit reviewed. Allergies and medications reviewed and updated. Outpatient Medications Prior to Visit  Medication Sig Dispense Refill   Multiple Vitamins-Minerals (MULTIVITAMIN & MINERAL PO) Take 1 tablet by mouth  daily. Reported on 03/19/2015     No facility-administered medications prior to visit.     Per HPI unless specifically indicated in ROS section below Review of Systems  Objective:  BP 120/62   Pulse 94   Temp 98.6 F (37 C) (Oral)   Ht 5\' 10"  (1.778 m)   Wt 231 lb (104.8 kg)   SpO2 94%   BMI 33.15 kg/m   Wt Readings from Last 3 Encounters:  02/14/23 231 lb (104.8 kg)  08/03/22 218 lb (98.9 kg)  07/29/22 219 lb 2 oz (99.4 kg)      Physical Exam   HEENT: Right ear canal clear, tympanic membrane gray with good light reflex. Left ear canal clear, tympanic membrane gray with good light reflex. Nasal mucosa without significant erythema or edema, turbinates not swollen or pale.  No sinus tenderness to percussion. Oral mucosa moist, no pharyngeal erythema or exudates. Pupils equally round and reactive to light. NECK: No lymphadenopathy cervical or otherwise. CHEST: Lungs clear to auscultation bilaterally, no crackles or wheezes. CARDIOVASCULAR: Normal S1, S2, no murmurs, rubs or gallops.      Assessment & Plan:      Eustachian Tube Dysfunction Persistent left-sided sinus congestion for several weeks following a viral upper respiratory infection and frequent air travel. No signs of infection or other complications. -Continue Flonase, reduce to one squirt in each nostril daily for the next 1-2 weeks. -Use nasal saline irrigation throughout the day to  maintain nasal mucosa moisture. -Resume anti-inflammatory (ibuprofen) at 400-600mg  with meals for the next 1-2 weeks. -Use Afrin for flights to prevent pressure build-up. -Consider nasal saline rinse with distilled water, not tap water.      Problem List Items Addressed This Visit     ETD (Eustachian tube dysfunction), left - Primary     No orders of the defined types were placed in this encounter.   No orders of the defined types were placed in this encounter.   Patient Instructions  VISIT SUMMARY: You came in today because  of persistent left-sided sinus congestion that has been bothering you for the past several weeks. 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 You have been using Flonase, over-the-counter nasal decongestants, and a neti pot with tap water, but these have not completely resolved your symptoms. You have a history of sinus problems and allergies, and you have been taking Allegra regularly.  YOUR PLAN: -EUSTACHIAN TUBE DYSFUNCTION: Eustachian Tube Dysfunction occurs when the tube connecting your middle ear to the back of your nose becomes blocked or does not open properly, often leading to a feeling of fullness or congestion. To manage this, continue using Flonase but reduce the dose to one squirt in each nostril daily for the next 1-2 weeks. Use nasal saline irrigation throughout the day to keep your nasal passages moist. Take ibuprofen at 400-600mg  with meals for the next 1-2 weeks to reduce inflammation. Use Afrin during flights to prevent pressure build-up, and consider using distilled water instead of tap water for your nasal saline rinse.  INSTRUCTIONS: Please follow up if your symptoms do not improve after 1-2 weeks or if they worsen. Continue with the current treatment plan and make sure to use distilled water for nasal rinses instead of tap water.  Follow up plan: No follow-ups on file.  Shaun Boyden, MD

## 2023-02-14 NOTE — Patient Instructions (Signed)
VISIT SUMMARY: You came in today because of persistent left-sided sinus congestion that has been bothering you for the past several weeks. 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 You have been using Flonase, over-the-counter nasal decongestants, and a neti pot with tap water, but these have not completely resolved your symptoms. You have a history of sinus problems and allergies, and you have been taking Allegra regularly.  YOUR PLAN: -EUSTACHIAN TUBE DYSFUNCTION: Eustachian Tube Dysfunction occurs when the tube connecting your middle ear to the back of your nose becomes blocked or does not open properly, often leading to a feeling of fullness or congestion. To manage this, continue using Flonase but reduce the dose to one squirt in each nostril daily for the next 1-2 weeks. Use nasal saline irrigation throughout the day to keep your nasal passages moist. Take ibuprofen at 400-600mg  with meals for the next 1-2 weeks to reduce inflammation. Use Afrin during flights to prevent pressure build-up, and consider using distilled water instead of tap water for your nasal saline rinse.  INSTRUCTIONS: Please follow up if your symptoms do not improve after 1-2 weeks or if they worsen. Continue with the current treatment plan and make sure to use distilled water for nasal rinses instead of tap water.

## 2023-04-27 ENCOUNTER — Other Ambulatory Visit: Payer: Self-pay | Admitting: Family Medicine

## 2023-04-27 DIAGNOSIS — E785 Hyperlipidemia, unspecified: Secondary | ICD-10-CM

## 2023-04-28 ENCOUNTER — Other Ambulatory Visit: Payer: Managed Care, Other (non HMO)

## 2023-05-01 ENCOUNTER — Other Ambulatory Visit (INDEPENDENT_AMBULATORY_CARE_PROVIDER_SITE_OTHER): Payer: Managed Care, Other (non HMO)

## 2023-05-01 DIAGNOSIS — E785 Hyperlipidemia, unspecified: Secondary | ICD-10-CM | POA: Diagnosis not present

## 2023-05-01 LAB — LIPID PANEL
Cholesterol: 226 mg/dL — ABNORMAL HIGH (ref 0–200)
HDL: 39.8 mg/dL (ref >39.00)
LDL Cholesterol: 141 mg/dL — ABNORMAL HIGH (ref 0–99)
NonHDL: 185.87
Total CHOL/HDL Ratio: 6
Triglycerides: 223 mg/dL — ABNORMAL HIGH (ref 0.0–149.0)
VLDL: 44.6 mg/dL — ABNORMAL HIGH (ref 0.0–40.0)

## 2023-05-01 LAB — COMPREHENSIVE METABOLIC PANEL
ALT: 20 U/L (ref 0–53)
AST: 20 U/L (ref 0–37)
Albumin: 4.8 g/dL (ref 3.5–5.2)
Alkaline Phosphatase: 51 U/L (ref 39–117)
BUN: 20 mg/dL (ref 6–23)
CO2: 30 meq/L (ref 19–32)
Calcium: 9.2 mg/dL (ref 8.4–10.5)
Chloride: 102 meq/L (ref 96–112)
Creatinine, Ser: 1.2 mg/dL (ref 0.40–1.50)
GFR: 72.22 mL/min (ref >60.00)
Glucose, Bld: 92 mg/dL (ref 70–99)
Potassium: 4.3 meq/L (ref 3.5–5.1)
Sodium: 139 meq/L (ref 135–145)
Total Bilirubin: 1.1 mg/dL (ref 0.2–1.2)
Total Protein: 7.1 g/dL (ref 6.0–8.3)

## 2023-05-01 LAB — TSH: TSH: 3.51 u[IU]/mL (ref 0.35–5.50)

## 2023-05-05 ENCOUNTER — Ambulatory Visit (INDEPENDENT_AMBULATORY_CARE_PROVIDER_SITE_OTHER): Payer: Managed Care, Other (non HMO) | Admitting: Family Medicine

## 2023-05-05 ENCOUNTER — Encounter: Payer: Self-pay | Admitting: Family Medicine

## 2023-05-05 VITALS — BP 126/80 | HR 73 | Temp 98.0°F | Ht 70.5 in | Wt 226.1 lb

## 2023-05-05 DIAGNOSIS — E785 Hyperlipidemia, unspecified: Secondary | ICD-10-CM | POA: Diagnosis not present

## 2023-05-05 DIAGNOSIS — Z0000 Encounter for general adult medical examination without abnormal findings: Secondary | ICD-10-CM | POA: Diagnosis not present

## 2023-05-05 NOTE — Assessment & Plan Note (Signed)
 Preventative protocols reviewed and updated unless pt declined. Discussed healthy diet and lifestyle.

## 2023-05-05 NOTE — Assessment & Plan Note (Addendum)
Chronic, off statin. Reviewed ASCVD risk score.  Reviewed healthy diet choices to improve triglycerides and LDL levels. The 10-year ASCVD risk score (Arnett DK, et al., 2019) is: 3.9%   Values used to calculate the score:     Age: 48 years     Sex: Male     Is Non-Hispanic African American: No     Diabetic: No     Tobacco smoker: No     Systolic Blood Pressure: 126 mmHg     Is BP treated: No     HDL Cholesterol: 39.8 mg/dL     Total Cholesterol: 226 mg/dL

## 2023-05-05 NOTE — Patient Instructions (Signed)
Work on [employer] as discussed. You are doing well today Return as needed or in 1 year for next physical

## 2023-05-05 NOTE — Progress Notes (Signed)
Ph: [phone] Fax: [phone]   Patient ID: Shaun Phillips, male    DOB: 15-Nov-1975, 48 y.o.   MRN: [id number]  This visit was conducted in person.  BP 126/80   Pulse 73   Temp 98 F (36.7 C) (Oral)   Ht 5' 10.5" (1.791 m)   Wt 226 lb 2 oz (102.6 kg)   SpO2 97%   BMI 31.99 kg/m    CC: CPE Subjective:   HPI: Shaun Phillips is a 48 y.o. male presenting on 05/05/2023 for Annual Exam   Suffered L achilles tendon rupture s/p repair 08/2022. Almost fully released, still avoiding force loading. Has restarted lifting weights.   Preventative: Colonoscopy - done 6[address]84 WNL by Dr Bethanie Dicker with [facility] ([place] in [place]. Rpt 10 yrs Flu shot - yearly COVID vaccine - 06/2019, 07/2019, booster x1  Td 2009, declined Seat belt use disucssed Sunscreen use discussed. No changing moles on skin. Sees derm yearly ([facility]).  Sleep -  Ex-smoker - quit 2013  Alcohol - seldom Dentist q6 mo Eye exam - [facility] 2025   Lives with wife and 2 sons, 1 dog Occ: [employer], then [employer], now Network engineer of new Building surveyor in [place] Activity: regularly at gym  Diet: good water, fruits/vegetables daily      Relevant past medical, surgical, family and social history reviewed and updated as indicated. Interim medical history since our last visit reviewed. Allergies and medications reviewed and updated. Outpatient Medications Prior to Visit  Medication Sig Dispense Refill   Multiple Vitamins-Minerals (MULTIVITAMIN & MINERAL PO) Take 1 tablet by mouth daily. Reported on 03/19/2015     No facility-administered medications prior to visit.     Per HPI unless specifically indicated in ROS section below Review of Systems  Constitutional:  Negative for activity change, appetite change, chills, fatigue, fever and unexpected weight change.  HENT:  Negative for hearing loss.   Eyes:  Negative for visual disturbance.  Respiratory:  Negative for cough, chest  tightness, shortness of breath and wheezing.   Cardiovascular:  Negative for chest pain, palpitations and leg swelling.  Gastrointestinal:  Negative for abdominal distention, abdominal pain, blood in stool, constipation, diarrhea, nausea and vomiting.  Genitourinary:  Negative for difficulty urinating and hematuria.  Musculoskeletal:  Negative for arthralgias, myalgias and neck pain.  Skin:  Negative for rash.  Neurological:  Negative for dizziness, seizures, syncope and headaches.  Hematological:  Negative for adenopathy. Does not bruise/bleed easily.  Psychiatric/Behavioral:  Negative for dysphoric mood. The patient is not nervous/anxious.     Objective:  BP 126/80   Pulse 73   Temp 98 F (36.7 C) (Oral)   Ht 5' 10.5" (1.791 m)   Wt 226 lb 2 oz (102.6 kg)   SpO2 97%   BMI 31.99 kg/m   Wt Readings from Last 3 Encounters:  05/05/23 226 lb 2 oz (102.6 kg)  02/14/23 231 lb (104.8 kg)  08/03/22 218 lb (98.9 kg)      Physical Exam Vitals and nursing note reviewed.  Constitutional:      General: He is not in acute distress.    Appearance: Normal appearance. He is well-developed. He is not ill-appearing.  HENT:     Head: Normocephalic and atraumatic.     Right Ear: Hearing, tympanic membrane, ear canal and external ear normal.     Left Ear: Hearing, tympanic membrane, ear canal and external ear normal.     Mouth/Throat:  Mouth: Mucous membranes are moist.     Pharynx: Oropharynx is clear. No oropharyngeal exudate or posterior oropharyngeal erythema.  Eyes:     General: No scleral icterus.    Extraocular Movements: Extraocular movements intact.     Conjunctiva/sclera: Conjunctivae normal.     Pupils: Pupils are equal, round, and reactive to light.  Neck:     Thyroid: No thyroid mass or thyromegaly.  Cardiovascular:     Rate and Rhythm: Normal rate and regular rhythm.     Pulses: Normal pulses.          Radial pulses are 2+ on the right side and 2+ on the left side.      Heart sounds: Normal heart sounds. No murmur heard. Pulmonary:     Effort: Pulmonary effort is normal. No respiratory distress.     Breath sounds: Normal breath sounds. No wheezing, rhonchi or rales.  Abdominal:     General: Bowel sounds are normal. There is no distension.     Palpations: Abdomen is soft. There is no mass.     Tenderness: There is no abdominal tenderness. There is no guarding or rebound.     Hernia: No hernia is present.  Musculoskeletal:        General: Normal range of motion.     Cervical back: Normal range of motion and neck supple.     Right lower leg: No edema.     Left lower leg: No edema.  Lymphadenopathy:     Cervical: No cervical adenopathy.  Skin:    General: Skin is warm and dry.     Findings: No rash.  Neurological:     General: No focal deficit present.     Mental Status: He is alert and oriented to person, place, and time.  Psychiatric:        Mood and Affect: Mood normal.        Behavior: Behavior normal.        Thought Content: Thought content normal.        Judgment: Judgment normal.       Results for orders placed or performed in visit on 05/01/23  TSH   Collection Time: 05/01/23  8:01 AM  Result Value Ref Range   TSH 3.51 0.35 - 5.50 uIU/mL  Comprehensive metabolic panel   Collection Time: 05/01/23  8:01 AM  Result Value Ref Range   Sodium 139 135 - 145 mEq/L   Potassium 4.3 3.5 - 5.1 mEq/L   Chloride 102 96 - 112 mEq/L   CO2 30 19 - 32 mEq/L   Glucose, Bld 92 70 - 99 mg/dL   BUN 20 6 - 23 mg/dL   Creatinine, Ser 1.61 0.40 - 1.50 mg/dL   Total Bilirubin 1.1 0.2 - 1.2 mg/dL   Alkaline Phosphatase 51 39 - 117 U/L   AST 20 0 - 37 U/L   ALT 20 0 - 53 U/L   Total Protein 7.1 6.0 - 8.3 g/dL   Albumin 4.8 3.5 - 5.2 g/dL   GFR 09.60 >45.40 mL/min   Calcium 9.2 8.4 - 10.5 mg/dL  Lipid panel   Collection Time: 05/01/23  8:01 AM  Result Value Ref Range   Cholesterol 226 (H) 0 - 200 mg/dL   Triglycerides 981.1 (H) 0.0 - 149.0 mg/dL    HDL 91.47 >82.95 mg/dL   VLDL 62.1 (H) 0.0 - 30.8 mg/dL   LDL Cholesterol 657 (H) 0 - 99 mg/dL   Total CHOL/HDL Ratio 6  NonHDL 185.87     Assessment & Plan:   Problem List Items Addressed This Visit     Health maintenance examination - Primary (Chronic)   Preventative protocols reviewed and updated unless pt declined. Discussed healthy diet and lifestyle.       Dyslipidemia   Chronic, off statin. Reviewed ASCVD risk score.  Reviewed healthy diet choices to improve triglycerides and LDL levels. The 10-year ASCVD risk score (Arnett DK, et al., 2019) is: 3.9%   Values used to calculate the score:     Age: 22 years     Sex: Male     Is Non-Hispanic African American: No     Diabetic: No     Tobacco smoker: No     Systolic Blood Pressure: 126 mmHg     Is BP treated: No     HDL Cholesterol: 39.8 mg/dL     Total Cholesterol: 226 mg/dL         No orders of the defined types were placed in this encounter.   No orders of the defined types were placed in this encounter.   Patient Instructions  Work on diet choices as discussed. You are doing well today Return as needed or in 1 year for next physical  Follow up plan: Return in about 1 year (around 05/04/2024) for annual exam, prior fasting for blood work.  Eustaquio Boyden, MD

## 2023-06-12 IMAGING — US US ABDOMEN COMPLETE
1 series · 14 of 25 positions shown · non-contrast
Comparison: None.

CLINICAL DATA: Periumbilical abdominal pain.

EXAM:
ABDOMEN ULTRASOUND COMPLETE

[Series 1: us abdomen complete · 0.23mm/px · 14 of 71 slices shown]
[im 1/71]
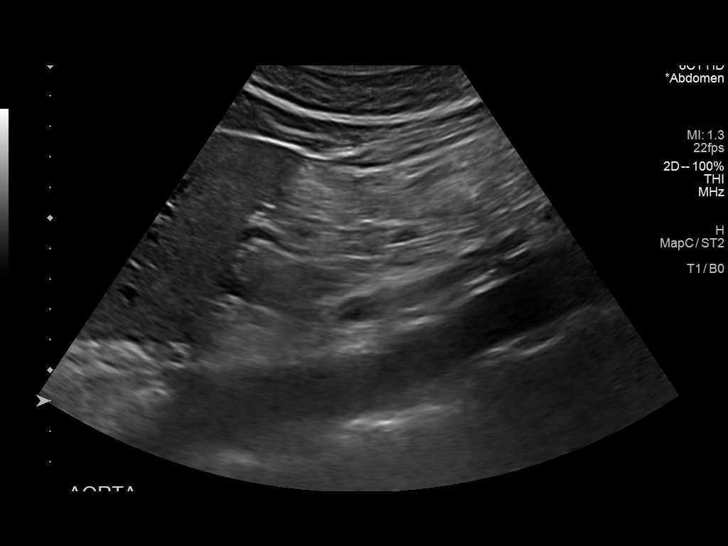
[im 6/71]
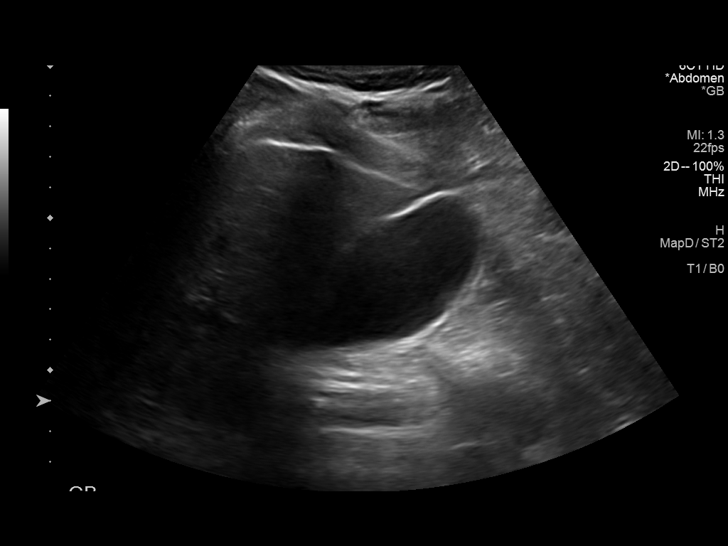
[im 12/71]
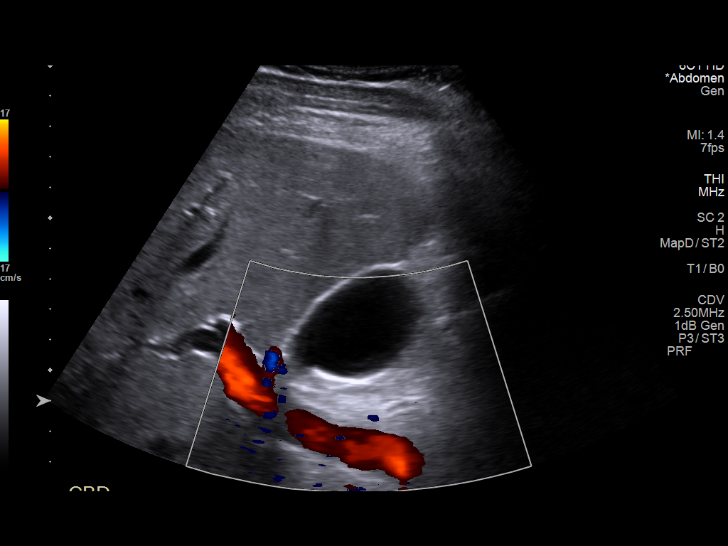
[im 18/71]
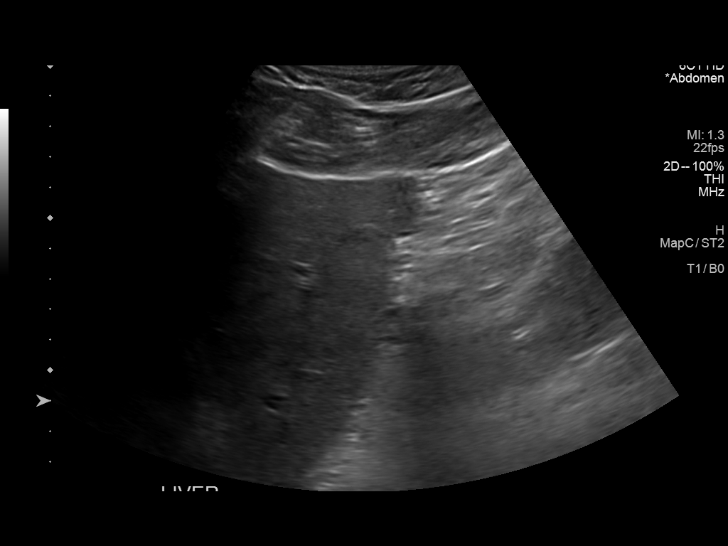
[im 24/71]
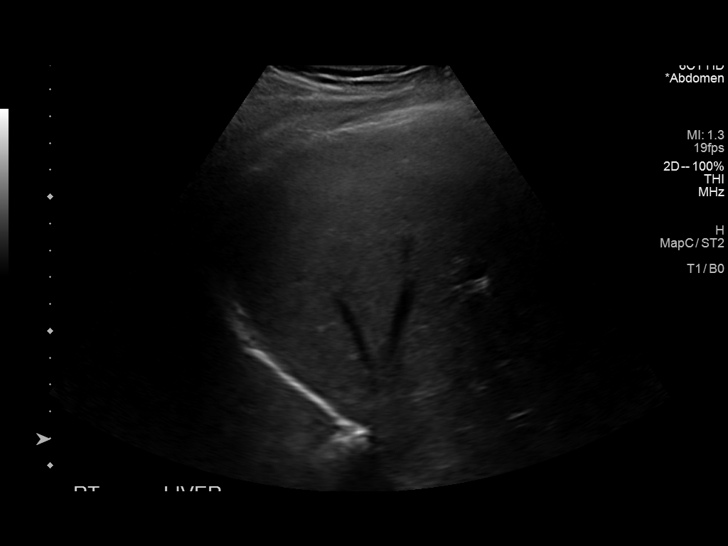
[im 27/71]
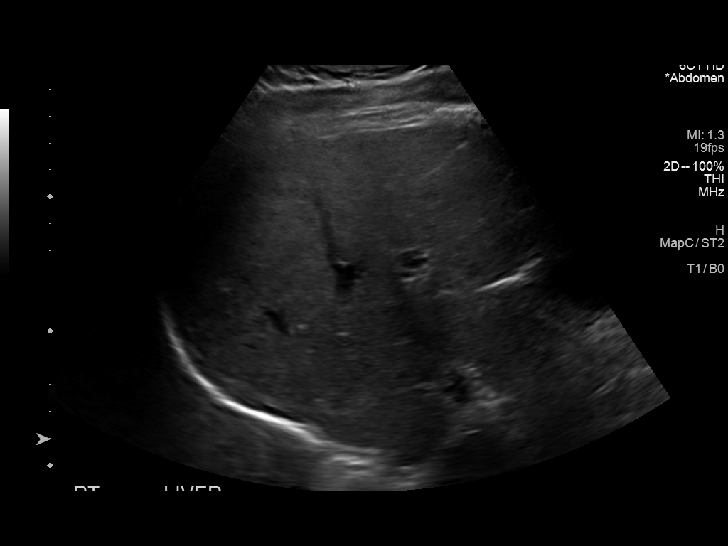
[im 33/71]
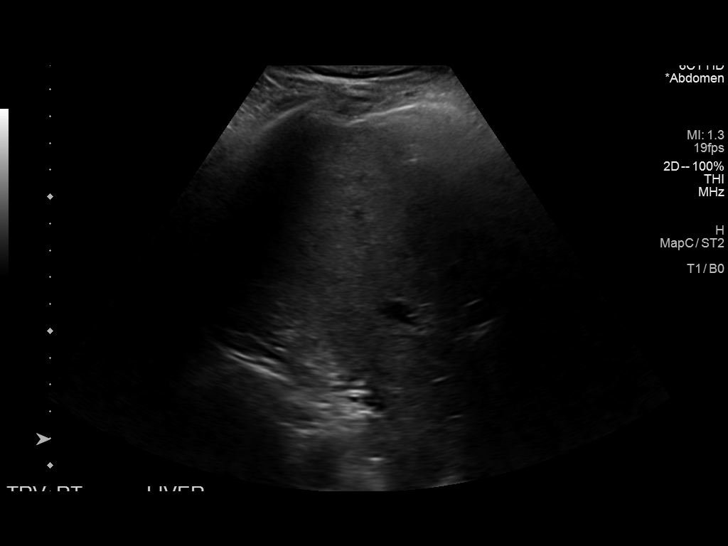
[im 38/71]
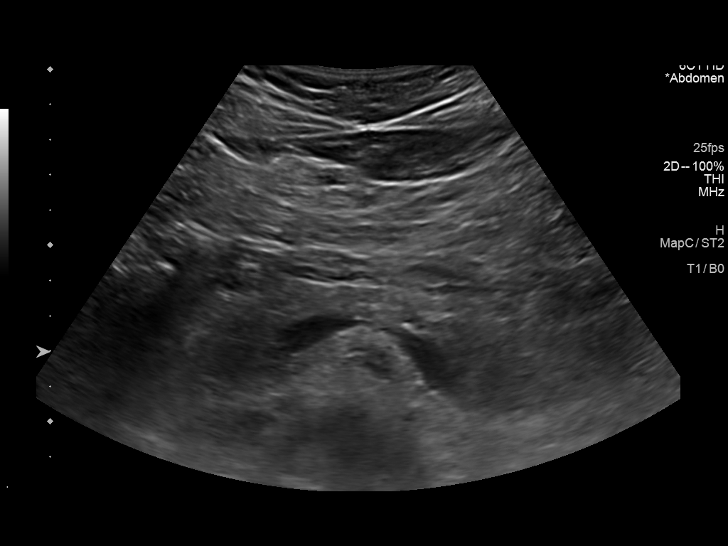
[im 44/71]
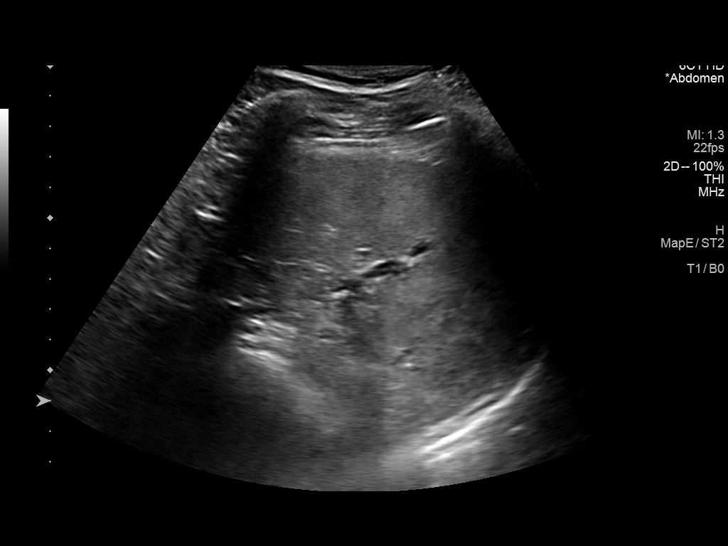
[im 47/71]
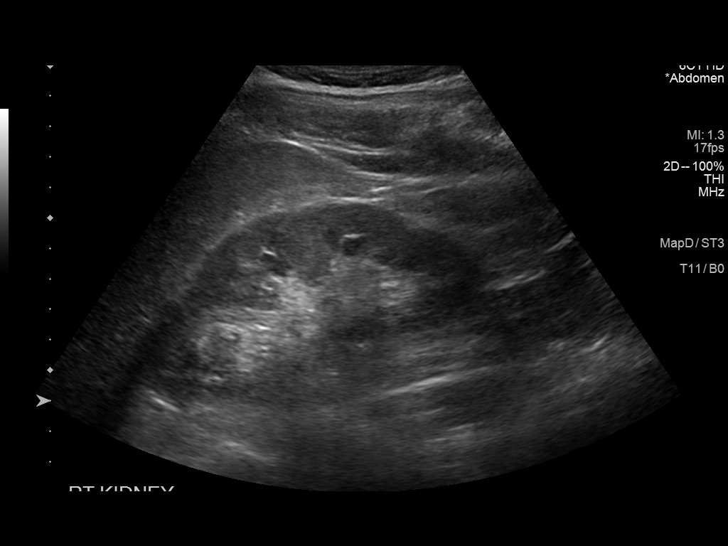
[im 53/71]
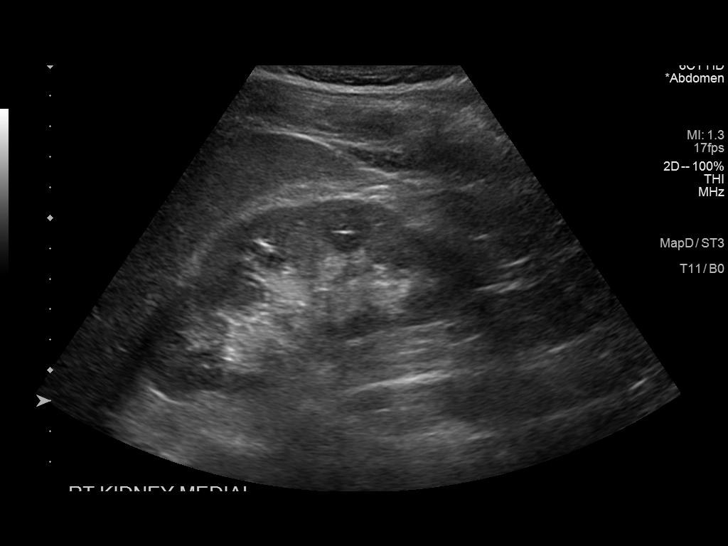
[im 59/71]
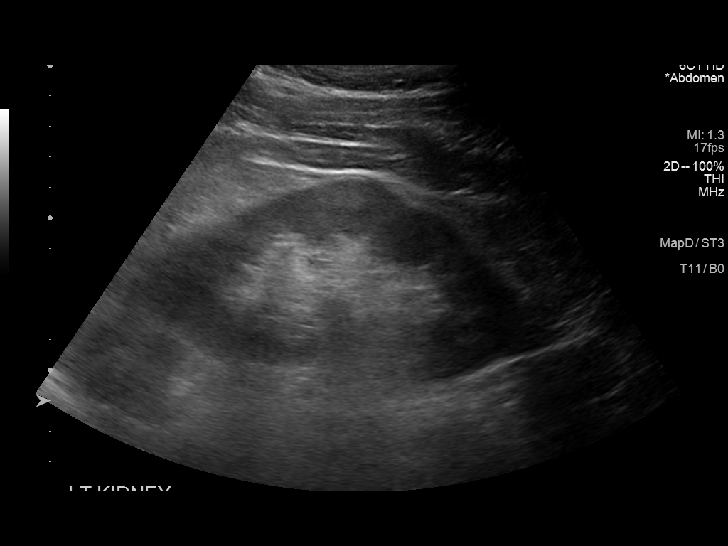
[im 65/71]
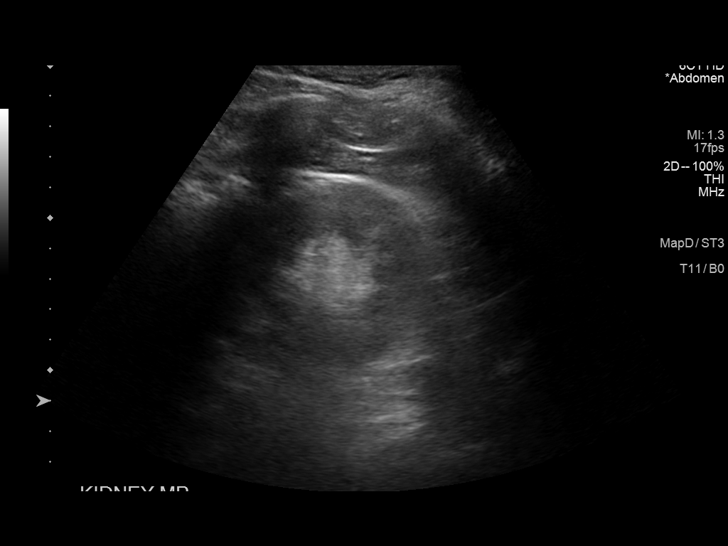
[im 71/71]
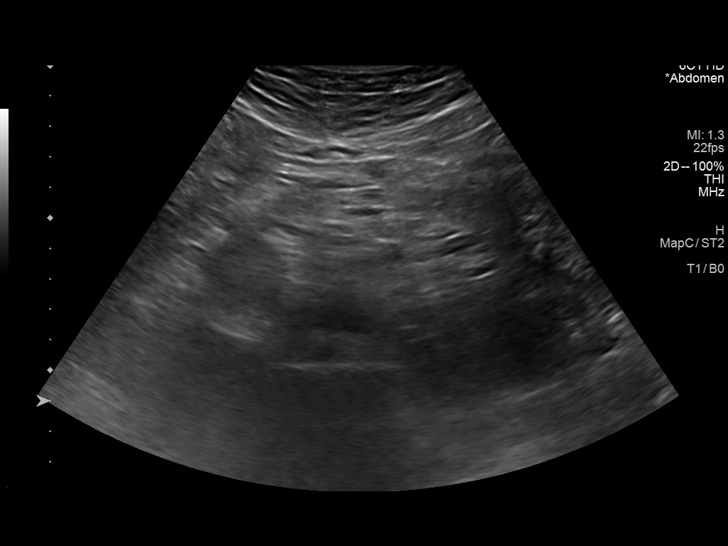

[14 of 25 positions shown; findings below may reference images not displayed]

FINDINGS: Gallbladder: No gallstones or wall thickening visualized (1.6 mm).
No sonographic Murphy sign noted by sonographer.

Common bile duct: Diameter: 2.2 mm

Liver: No focal lesion identified. Within normal limits in
parenchymal echogenicity. Portal vein is patent on color Doppler
imaging with normal direction of blood flow towards the liver.

IVC: No abnormality visualized.

Pancreas: Visualized portion unremarkable.

Spleen: Size (5.7 cm) and appearance within normal limits.

Right Kidney: Length: 12.03 cm. Echogenicity within normal limits.
No mass or hydronephrosis visualized.

Left Kidney: Length: 12.76 cm. Echogenicity within normal limits. No
mass or hydronephrosis visualized.

Abdominal aorta: No aneurysm visualized (1.81 cm in AP diameter).

Other findings: None.
IMPRESSION: Normal abdominal ultrasound.

## 2023-07-30 ENCOUNTER — Observation Stay
Admission: EM | Admit: 2023-07-30 | Discharge: 2023-07-31 | Disposition: A | Discharge status: Home / Self Care | Attending: Surgery | Admitting: Surgery

## 2023-07-30 ENCOUNTER — Encounter: Payer: Self-pay | Admitting: Emergency Medicine

## 2023-07-30 ENCOUNTER — Other Ambulatory Visit: Payer: Self-pay

## 2023-07-30 ENCOUNTER — Emergency Department

## 2023-07-30 DIAGNOSIS — Z8616 Personal history of COVID-19: Secondary | ICD-10-CM | POA: Diagnosis not present

## 2023-07-30 DIAGNOSIS — J45909 Unspecified asthma, uncomplicated: Secondary | ICD-10-CM | POA: Diagnosis not present

## 2023-07-30 DIAGNOSIS — R7309 Other abnormal glucose: Secondary | ICD-10-CM | POA: Diagnosis not present

## 2023-07-30 DIAGNOSIS — Z87891 Personal history of nicotine dependence: Secondary | ICD-10-CM | POA: Insufficient documentation

## 2023-07-30 DIAGNOSIS — R1031 Right lower quadrant pain: Secondary | ICD-10-CM | POA: Diagnosis present

## 2023-07-30 DIAGNOSIS — K3580 Unspecified acute appendicitis: Principal | ICD-10-CM | POA: Diagnosis present

## 2023-07-30 LAB — CBC
HCT: 43.8 % (ref 39.0–52.0)
Hemoglobin: 14.7 g/dL (ref 13.0–17.0)
MCH: 29.2 pg (ref 26.0–34.0)
MCHC: 33.6 g/dL (ref 30.0–36.0)
MCV: 87.1 fL (ref 80.0–100.0)
Platelets: 285 10*3/uL (ref 150–400)
RBC: 5.03 MIL/uL (ref 4.22–5.81)
RDW: 12.4 % (ref 11.5–15.5)
WBC: 19.9 10*3/uL — ABNORMAL HIGH (ref 4.0–10.5)
nRBC: 0 % (ref 0.0–0.2)

## 2023-07-30 LAB — COMPREHENSIVE METABOLIC PANEL WITH GFR
ALT: 26 U/L (ref 0–44)
AST: 24 U/L (ref 15–41)
Albumin: 4.9 g/dL (ref 3.5–5.0)
Alkaline Phosphatase: 56 U/L (ref 38–126)
Anion gap: 9 (ref 5–15)
BUN: 18 mg/dL (ref 6–20)
CO2: 26 mmol/L (ref 22–32)
Calcium: 9.6 mg/dL (ref 8.9–10.3)
Chloride: 100 mmol/L (ref 98–111)
Creatinine, Ser: 1.06 mg/dL (ref 0.61–1.24)
GFR, Estimated: >60 mL/min (ref >60)
Glucose, Bld: 140 mg/dL — ABNORMAL HIGH (ref 70–99)
Potassium: 3.8 mmol/L (ref 3.5–5.1)
Sodium: 135 mmol/L (ref 135–145)
Total Bilirubin: 1.2 mg/dL (ref 0.0–1.2)
Total Protein: 8 g/dL (ref 6.5–8.1)

## 2023-07-30 LAB — URINALYSIS, ROUTINE W REFLEX MICROSCOPIC
Bilirubin Urine: NEGATIVE
Glucose, UA: NEGATIVE mg/dL
Hgb urine dipstick: NEGATIVE
Ketones, ur: NEGATIVE mg/dL
Leukocytes,Ua: NEGATIVE
Nitrite: NEGATIVE
Protein, ur: NEGATIVE mg/dL
Specific Gravity, Urine: >1.046 — ABNORMAL HIGH (ref 1.005–1.030)
pH: 6 (ref 5.0–8.0)

## 2023-07-30 LAB — GLUCOSE, CAPILLARY: Glucose-Capillary: 133 mg/dL — ABNORMAL HIGH (ref 70–99)

## 2023-07-30 LAB — LIPASE, BLOOD: Lipase: 28 U/L (ref 11–51)

## 2023-07-30 LAB — MRSA NEXT GEN BY PCR, NASAL: MRSA by PCR Next Gen: NOT DETECTED

## 2023-07-30 MED ORDER — IOHEXOL 350 MG/ML SOLN
100.0000 mL | Freq: Once | INTRAVENOUS | Status: AC | PRN
  Administered 2023-07-30: 100 mL via INTRAVENOUS

## 2023-07-30 MED ORDER — ZOLPIDEM TARTRATE 5 MG PO TABS
5.0000 mg | ORAL_TABLET | Freq: Every evening | ORAL | Status: DC | PRN
  Administered 2023-07-30: 5 mg via ORAL
  Filled 2023-07-30: qty 1

## 2023-07-30 MED ORDER — PIPERACILLIN-TAZOBACTAM 3.375 G IVPB
3.3750 g | Freq: Three times a day (TID) | INTRAVENOUS | Status: DC
  Administered 2023-07-31: 3.375 g via INTRAVENOUS
  Filled 2023-07-30: qty 50

## 2023-07-30 MED ORDER — LACTATED RINGERS IV SOLN: INTRAVENOUS | Status: DC

## 2023-07-30 MED ORDER — SODIUM CHLORIDE 0.9 % IV BOLUS
500.0000 mL | Freq: Once | INTRAVENOUS | Status: AC
  Administered 2023-07-30: 500 mL via INTRAVENOUS

## 2023-07-30 MED ORDER — ONDANSETRON HCL 4 MG/2ML IJ SOLN: 4.0000 mg | Freq: Four times a day (QID) | INTRAMUSCULAR | Status: DC | PRN

## 2023-07-30 MED ORDER — DIPHENHYDRAMINE HCL 25 MG PO CAPS: 25.0000 mg | ORAL_CAPSULE | Freq: Four times a day (QID) | ORAL | Status: DC | PRN

## 2023-07-30 MED ORDER — HYDRALAZINE HCL 20 MG/ML IJ SOLN: 10.0000 mg | INTRAMUSCULAR | Status: DC | PRN

## 2023-07-30 MED ORDER — DIPHENHYDRAMINE HCL 50 MG/ML IJ SOLN: 25.0000 mg | Freq: Four times a day (QID) | INTRAMUSCULAR | Status: DC | PRN

## 2023-07-30 MED ORDER — OXYCODONE-ACETAMINOPHEN 5-325 MG PO TABS
1.0000 | ORAL_TABLET | ORAL | Status: DC | PRN
  Administered 2023-07-30: 2 via ORAL
  Filled 2023-07-30: qty 2

## 2023-07-30 MED ORDER — IBUPROFEN 400 MG PO TABS: 600.0000 mg | ORAL_TABLET | Freq: Four times a day (QID) | ORAL | Status: DC | PRN

## 2023-07-30 MED ORDER — MORPHINE SULFATE (PF) 4 MG/ML IV SOLN
4.0000 mg | Freq: Once | INTRAVENOUS | Status: AC
  Administered 2023-07-30: 4 mg via INTRAVENOUS
  Filled 2023-07-30: qty 1

## 2023-07-30 MED ORDER — PANTOPRAZOLE SODIUM 40 MG IV SOLR
40.0000 mg | Freq: Every day | INTRAVENOUS | Status: DC
  Administered 2023-07-30: 40 mg via INTRAVENOUS
  Filled 2023-07-30: qty 10

## 2023-07-30 MED ORDER — ORAL CARE MOUTH RINSE: 15.0000 mL | OROMUCOSAL | Status: DC | PRN

## 2023-07-30 MED ORDER — HYDROMORPHONE HCL 1 MG/ML IJ SOLN: 1.0000 mg | INTRAMUSCULAR | Status: DC | PRN

## 2023-07-30 MED ORDER — ONDANSETRON 4 MG PO TBDP: 4.0000 mg | ORAL_TABLET | Freq: Four times a day (QID) | ORAL | Status: DC | PRN

## 2023-07-30 MED ORDER — ONDANSETRON HCL 4 MG/2ML IJ SOLN
4.0000 mg | Freq: Once | INTRAMUSCULAR | Status: AC
  Administered 2023-07-30: 4 mg via INTRAVENOUS
  Filled 2023-07-30: qty 2

## 2023-07-30 MED ORDER — PIPERACILLIN-TAZOBACTAM 3.375 G IVPB 30 MIN
3.3750 g | Freq: Once | INTRAVENOUS | Status: AC
  Administered 2023-07-30: 3.375 g via INTRAVENOUS
  Filled 2023-07-30 (×2): qty 50

## 2023-07-30 NOTE — ED Notes (Signed)
 47 yom with a c/c of abdominal pain and some vomiting for today. The pt advised he has had ongoing abdominal pain. The pt is warm, pink, and dry. Alert and oriented x 4. Mother at bedside.

## 2023-07-30 NOTE — H&P (Signed)
 Patient ID: Shaun Phillips, male   DOB: Feb 20, 1976, 48 y.o.   MRN: [id number]  Chief Complaint:  Abdominal pain.   History of Present Illness Shaun Phillips is a 48 y.o. male with RLQ pain beginning this AM, severe and associated with nausea.  Worse with lifting right leg and other passive or active movement.  Denies dysuria, f/c, v, diarrhea or constipation.   Past Medical History Past Medical History:  Diagnosis Date   Allergy    Anxiety    Asthma    Hx as child   COVID-19 virus infection 01/2019   GERD (gastroesophageal reflux disease)    Heart murmur    Hx as child   HLD (hyperlipidemia)       Past Surgical History:  Procedure Laterality Date   ACHILLES TENDON SURGERY Left 08/03/2022   Procedure: ACHILLES TENDON REPAIR;  Surgeon: Elner Hahn, MD;  Location: [facility] ORS;  Service: Orthopedics;  Laterality: Left;   ANKLE SURGERY  04/04/2002   left   COLONOSCOPY  08/2021   WNL, rpt 10 yrs (Dr Vernard Goldberg at [facility] in [place])   ESOPHAGOGASTRODUODENOSCOPY  06/03/2007   HIATAL HERNIA AND GERD   FINGER SURGERY  04/04/1993   left 2nd finger repair    Allergies  Allergen Reactions   Ciprofloxacin Other (See Comments)    Chills, rigor.  No hives, shortness of breath or tongue/lip swelling    Current Facility-Administered Medications  Medication Dose Route Frequency Provider Last Rate Last Admin   diphenhydrAMINE (BENADRYL) capsule 25 mg  25 mg Oral Q6H PRN Flynn Hylan, MD       Or   diphenhydrAMINE (BENADRYL) injection 25 mg  25 mg Intravenous Q6H PRN Flynn Hylan, MD       hydrALAZINE (APRESOLINE) injection 10 mg  10 mg Intravenous Q2H PRN Flynn Hylan, MD       HYDROmorphone  (DILAUDID ) injection 1 mg  1 mg Intravenous Q3H PRN Flynn Hylan, MD       ibuprofen (ADVIL) tablet 600 mg  600 mg Oral Q6H PRN Flynn Hylan, MD       lactated ringers  infusion   Intravenous Continuous Flynn Hylan, MD       ondansetron  (ZOFRAN -ODT) disintegrating tablet  4 mg  4 mg Oral Q6H PRN Flynn Hylan, MD       Or   ondansetron  (ZOFRAN ) injection 4 mg  4 mg Intravenous Q6H PRN Flynn Hylan, MD       oxyCODONE -acetaminophen  (PERCOCET/ROXICET) 5-325 MG per tablet 1-2 tablet  1-2 tablet Oral Q4H PRN Flynn Hylan, MD       pantoprazole (PROTONIX) injection 40 mg  40 mg Intravenous QHS Flynn Hylan, MD       piperacillin-tazobactam (ZOSYN) IVPB 3.375 g  3.375 g Intravenous Once Kinner, Robert, MD 100 mL/hr at 07/30/23 1944 3.375 g at 07/30/23 1944   piperacillin-tazobactam (ZOSYN) IVPB 3.375 g  3.375 g Intravenous Q8H Flynn Hylan, MD       zolpidem (AMBIEN) tablet 5 mg  5 mg Oral QHS PRN,MR X 1 Flynn Hylan, MD       No current outpatient medications on file.    Family History Family History  Problem Relation Age of Onset   Depression Mother    Diabetes Father    Hypertension Paternal Grandmother    CAD Paternal Grandmother    Hypertension Paternal Grandfather    CAD Paternal Grandfather    Cancer Neg Hx        prostate or colon  Social History Social History   Tobacco Use   Smoking status: Former    Current packs/day: 0.00    Types: Cigarettes    Quit date: 04/05/2011    Years since quitting: 12.3   Smokeless tobacco: Never  Substance Use Topics   Alcohol use: Yes    Comment: intermittent beers -- esp if out with friends   Drug use: No        Review of Systems  All other systems reviewed and are negative.    Physical Exam Blood pressure 130/83, pulse 95, temperature 99.6 F (37.6 C), temperature source Oral, resp. rate 18, height 5\' 10"  (1.778 m), weight 100.7 kg, SpO2 100%. Last Weight  Most recent update: 07/30/2023  4:01 PM    Weight  100.7 kg (222 lb)             CONSTITUTIONAL: Well developed, and nourished, appropriately responsive and aware without distress.   EYES: Sclera non-icteric.   EARS, NOSE, MOUTH AND THROAT:  The oropharynx is clear. Oral mucosa is pink and moist.     Hearing  is intact to voice.  NECK: Trachea is midline, and there is no jugular venous distension.  LYMPH NODES:  Lymph nodes in the neck are not appreciated. RESPIRATORY:  Lungs are clear, and breath sounds are equal bilaterally.  Normal respiratory effort without pathologic use of accessory muscles. CARDIOVASCULAR: Heart is regular in rate and rhythm.   Well perfused.  GI: The abdomen is tender in the RLQ, o/w soft, nontender, and nondistended. There were no palpable masses.  I did not appreciate hepatosplenomegaly. There were normal bowel sounds.   MUSCULOSKELETAL:  Symmetrical muscle tone appreciated in all four extremities.    SKIN: Skin turgor is normal. No pathologic skin lesions appreciated.  NEUROLOGIC:  Motor and sensation appear grossly normal.  Cranial nerves are grossly without defect. PSYCH:  Alert and oriented to person, place and time. Affect is appropriate for situation.  Data Reviewed I have personally reviewed what is currently available of the patient's imaging, recent labs and medical records.   Labs:     Latest Ref Rng & Units 07/30/2023    4:03 PM 07/29/2022   12:21 PM 01/25/2022    2:41 PM  CBC  WBC 4.0 - 10.5 K/uL 19.9  9.2  11.5   Hemoglobin 13.0 - 17.0 g/dL 56.2  13.0  86.5   Hematocrit 39.0 - 52.0 % 43.8  43.3  42.1   Platelets 150 - 400 K/uL 285  263.0  263.0       Latest Ref Rng & Units 07/30/2023    4:03 PM 05/01/2023    8:01 AM 07/29/2022   12:21 PM  CMP  Glucose 70 - 99 mg/dL 784  92  77   BUN 6 - 20 mg/dL 18  20  19    Creatinine 0.61 - 1.24 mg/dL 6.96  2.95  2.84   Sodium 135 - 145 mmol/L 135  139  138   Potassium 3.5 - 5.1 mmol/L 3.8  4.3  4.0   Chloride 98 - 111 mmol/L 100  102  100   CO2 22 - 32 mmol/L 26  30  29    Calcium 8.9 - 10.3 mg/dL 9.6  9.2  9.6   Total Protein 6.5 - 8.1 g/dL 8.0  7.1  7.6   Total Bilirubin 0.0 - 1.2 mg/dL 1.2  1.1  1.2   Alkaline Phos 38 - 126 U/L 56  51  50   AST  15 - 41 U/L 24  20  21    ALT 0 - 44 U/L 26  20  24         Imaging: Radiological images reviewed:   Within last 24 hrs: CT ABDOMEN PELVIS W CONTRAST Result Date: 07/30/2023 EXAM: CT ABDOMEN AND PELVIS WITH CONTRAST 07/30/2023 05:21:03 PM TECHNIQUE: CT of the abdomen and pelvis was performed with intravenous contrast. Multiplanar reformatted images are provided for review. Automated exposure control, iterative reconstruction, and/or weight based adjustment of the mA/kV was utilized to reduce the radiation dose to as low as reasonably achievable. COMPARISON: None available. CLINICAL HISTORY: RLQ abdominal pain. 47 yom with a c/c of abdominal pain and some vomiting for today. The pt advised he has had ongoing abdominal pain. The pt is warm, pink, and dry. Alert and oriented x 4. Mother at bedside. FINDINGS: LOWER CHEST: Mild right basilar scarring/atelectasis. HEPATOBILIARY: The liver is unremarkable. Gallbladder is unremarkable. No biliary ductal dilatation. SPLEEN: No acute abnormality. PANCREAS: No acute abnormality. ADRENAL GLANDS: No acute abnormality. KIDNEYS, URETERS: No stones in the kidneys or ureters. No evidence of hydronephrosis. No evidence of perinephric or periureteral stranding. GI AND BOWEL: Abnormal, dilated appendix, measuring up to 11 mm distally (image 69), with very mild periappendiceal stranding, suggesting acute appendicitis. No drainable fluid collection or abscess. No free air to suggest perforation. PELVIS: Urinary bladder is unremarkable. PERITONEUM AND RETROPERITONEUM: No evidence of free fluid or free air. LYMPH NODES: No evidence of lymphadenopathy. BONES AND SOFT TISSUES: No acute osseous abnormality. No focal soft tissue abnormality. Incidental adrenal and/or renal findings do not require follow up imaging. IMPRESSION: 1. Acute appendicitis. No drainable fluid collection or abscess. No free air to suggest perforation. Electronically signed by: Zadie Herter MD 07/30/2023 07:25 PM EDT RP Workstation: [id number]     Assessment    Acute appendicitis, uncomplicated 12 hr h/o pain.  Patient Active Problem List   Diagnosis Date Noted   Acute appendicitis 07/30/2023   ETD (Eustachian tube dysfunction), left 02/14/2023   Herpes labialis 02/27/2017   Near syncope 12/29/2014   Dyslipidemia 04/21/2014   Health maintenance examination 04/25/2012   Anxiety 11/02/2007   Allergic rhinitis 11/02/2007   GERD 11/02/2007    Plan    The risks, benefits, complications, treatment options, and expected outcomes were discussed with the patient. The treatment of antibiotics alone was discussed giving a 20% chance that this could fail and surgery would be necessary.  Also discussed continuing to the operating room for Laparoscopic Appendectomy.  The possibilities of  bleeding, recurrent infection, perforation of viscus, finding a normal appendix, the need for additional procedures, failure to diagnose a condition, conversion to open procedure and creating a complication requiring transfusion or further operations were discussed. The patient was given the opportunity to ask questions and have them answered.  Patient would like to proceed with Laparoscopic Appendectomy and consent was obtained.     Latest Ref Rng & Units 07/30/2023    4:03 PM 07/29/2022   12:21 PM 01/25/2022    2:41 PM  CBC  WBC 4.0 - 10.5 K/uL 19.9  9.2  11.5   Hemoglobin 13.0 - 17.0 g/dL 09.8  11.9  14.7   Hematocrit 39.0 - 52.0 % 43.8  43.3  42.1   Platelets 150 - 400 K/uL 285  263.0  263.0      APPENDECTOMY, LAPAROSCOPIC: 44970 (CPT)  Face-to-face time spent with the patient and accompanying care providers(if present) was 30 minutes, spent counseling, educating, and coordinating care  of the patient.    These notes generated with voice recognition software. I apologize for typographical errors.  Flynn Hylan M.D., FACS 07/30/2023, 7:56 PM

## 2023-07-30 NOTE — ED Triage Notes (Signed)
 Patient to ED via POV for generalized abd pain. Having intermittent nausea with 2 episodes of vomiting. Symptoms ongoing for months but worse today.

## 2023-07-30 NOTE — ED Provider Notes (Signed)
 [facility] Provider Note    Event Date/Time   First MD Initiated Contact with Patient 07/30/23 1620     (approximate)   History   Abdominal Pain   HPI  Shaun Phillips is a 48 y.o. male with no prior history of abdominal surgery presents with complaints of abdominal pain that started this morning.  He reports pain is significant, mild nausea.  Pain primarily in the right lower quadrant     Physical Exam   Triage Vital Signs: ED Triage Vitals  Encounter Vitals Group     BP 07/30/23 1602 (!) 143/91     Systolic BP Percentile --      Diastolic BP Percentile --      Pulse Rate 07/30/23 1602 86     Resp 07/30/23 1602 17     Temp 07/30/23 1602 98.6 F (37 C)     Temp Source 07/30/23 1602 Oral     SpO2 07/30/23 1602 100 %     Weight 07/30/23 1601 100.7 kg (222 lb)     Height 07/30/23 1601 1.778 m (5\' 10" )     Head Circumference --      Peak Flow --      Pain Score 07/30/23 1600 6     Pain Loc --      Pain Education --      Exclude from Growth Chart --     Most recent vital signs: Vitals:   07/30/23 1602 07/30/23 1913  BP: (!) 143/91 130/83  Pulse: 86 95  Resp: 17 18  Temp: 98.6 F (37 C) 99.6 F (37.6 C)  SpO2: 100% 100%     General: Awake, no distress.  CV:  Good peripheral perfusion.  Resp:  Normal effort.  Abd:  No distention.  Significant tenderness in the right lower quadrant Other:     ED Results / Procedures / Treatments   Labs (all labs ordered are listed, but only abnormal results are displayed) Labs Reviewed  COMPREHENSIVE METABOLIC PANEL WITH GFR - Abnormal; Notable for the following components:      Result Value   Glucose, Bld 140 (*)    All other components within normal limits  CBC - Abnormal; Notable for the following components:   WBC 19.9 (*)    All other components within normal limits  URINALYSIS, ROUTINE W REFLEX MICROSCOPIC - Abnormal; Notable for the following components:   Color, Urine STRAW (*)     APPearance CLEAR (*)    Specific Gravity, Urine >1.046 (*)    All other components within normal limits  LIPASE, BLOOD     EKG     RADIOLOGY CT scan consistent with acute appendicitis    PROCEDURES:  Critical Care performed:   Procedures   MEDICATIONS ORDERED IN ED: Medications  piperacillin-tazobactam (ZOSYN) IVPB 3.375 g (has no administration in time range)  morphine (PF) 4 MG/ML injection 4 mg (4 mg Intravenous Given 07/30/23 1653)  ondansetron  (ZOFRAN ) injection 4 mg (4 mg Intravenous Given 07/30/23 1651)  sodium chloride  0.9 % bolus 500 mL (0 mLs Intravenous Stopped 07/30/23 1914)  iohexol (OMNIPAQUE) 350 MG/ML injection 100 mL (100 mLs Intravenous Contrast Given 07/30/23 1717)     IMPRESSION / MDM / ASSESSMENT AND PLAN / ED COURSE  I reviewed the triage vital signs and the nursing notes. Patient's presentation is most consistent with acute presentation with potential threat to life or bodily function.  Patient presents with right lower quadrant abdominal pain, suspicious for appendicitis  versus ureterolithiasis.  Less likely cholecystitis.  Will treat with IV morphine, IV Zofran  and send for CT abdomen pelvis  WBC is elevated at 19.9.  Patient feeling better after morphine,  ----------------------------------------- 7:39 PM on 07/30/2023 ----------------------------------------- Radiology reports CT is consistent with acute appendicitis, have consulted with Dr. Ofilia Benton of general surgery who will determine whether the patient will have surgery tonight or tomorrow morning      FINAL CLINICAL IMPRESSION(S) / ED DIAGNOSES   Final diagnoses:  Acute appendicitis, unspecified acute appendicitis type     Rx / DC Orders   ED Discharge Orders     None        Note:  This document was prepared using Dragon voice recognition software and may include unintentional dictation errors.   Bryson Carbine, MD 07/30/23 [phone]

## 2023-07-30 NOTE — ED Notes (Signed)
 Family stated that the patient is very shaky and cold after CT

## 2023-07-31 ENCOUNTER — Observation Stay: Admitting: Anesthesiology

## 2023-07-31 ENCOUNTER — Other Ambulatory Visit: Payer: Self-pay

## 2023-07-31 ENCOUNTER — Encounter: Payer: Self-pay | Admitting: Surgery

## 2023-07-31 ENCOUNTER — Encounter
Admission: EM | Disposition: A | Discharge status: Home / Self Care | Payer: Self-pay | Source: Home / Self Care | Attending: Emergency Medicine

## 2023-07-31 DIAGNOSIS — K3580 Unspecified acute appendicitis: Secondary | ICD-10-CM | POA: Diagnosis not present

## 2023-07-31 HISTORY — PX: LAPAROSCOPIC APPENDECTOMY: SHX408

## 2023-07-31 LAB — HIV ANTIBODY (ROUTINE TESTING W REFLEX): HIV Screen 4th Generation wRfx: NONREACTIVE

## 2023-07-31 SURGERY — APPENDECTOMY, LAPAROSCOPIC
Anesthesia: General

## 2023-07-31 MED ORDER — KETOROLAC TROMETHAMINE 30 MG/ML IJ SOLN
INTRAMUSCULAR | Status: DC | PRN
  Administered 2023-07-31: 30 mg via INTRAVENOUS

## 2023-07-31 MED ORDER — DEXMEDETOMIDINE HCL IN NACL 80 MCG/20ML IV SOLN
INTRAVENOUS | Status: DC | PRN
  Administered 2023-07-31: 12 ug via INTRAVENOUS
  Administered 2023-07-31: 8 ug via INTRAVENOUS

## 2023-07-31 MED ORDER — MIDAZOLAM HCL 2 MG/2ML IJ SOLN
INTRAMUSCULAR | Status: AC
  Filled 2023-07-31: qty 2

## 2023-07-31 MED ORDER — SUGAMMADEX SODIUM 200 MG/2ML IV SOLN
INTRAVENOUS | Status: DC | PRN
  Administered 2023-07-31: 200 mg via INTRAVENOUS

## 2023-07-31 MED ORDER — OXYCODONE-ACETAMINOPHEN 5-325 MG PO TABS: 1.0000 | ORAL_TABLET | ORAL | 0 refills | Status: DC | PRN

## 2023-07-31 MED ORDER — DEXAMETHASONE SODIUM PHOSPHATE 10 MG/ML IJ SOLN
INTRAMUSCULAR | Status: DC | PRN
  Administered 2023-07-31: 8 mg via INTRAVENOUS

## 2023-07-31 MED ORDER — FENTANYL CITRATE (PF) 100 MCG/2ML IJ SOLN
INTRAMUSCULAR | Status: DC | PRN
Start: 2023-07-31 — End: 2023-07-31
  Administered 2023-07-31: 100 ug via INTRAVENOUS

## 2023-07-31 MED ORDER — LIDOCAINE HCL (CARDIAC) PF 100 MG/5ML IV SOSY
PREFILLED_SYRINGE | INTRAVENOUS | Status: DC | PRN
Start: 2023-07-31 — End: 2023-07-31
  Administered 2023-07-31: 80 mg via INTRAVENOUS

## 2023-07-31 MED ORDER — SODIUM CHLORIDE 0.9 % IV SOLN: INTRAVENOUS | Status: DC | PRN

## 2023-07-31 MED ORDER — BUPIVACAINE-EPINEPHRINE 0.25% -1:200000 IJ SOLN
INTRAMUSCULAR | Status: DC | PRN
  Administered 2023-07-31: 20 mL
  Administered 2023-07-31: 10 mL

## 2023-07-31 MED ORDER — ONDANSETRON HCL 4 MG/2ML IJ SOLN
INTRAMUSCULAR | Status: DC | PRN
  Administered 2023-07-31: 4 mg via INTRAVENOUS

## 2023-07-31 MED ORDER — ACETAMINOPHEN 10 MG/ML IV SOLN
INTRAVENOUS | Status: DC | PRN
  Administered 2023-07-31: 1000 mg via INTRAVENOUS

## 2023-07-31 MED ORDER — ROCURONIUM BROMIDE 100 MG/10ML IV SOLN
INTRAVENOUS | Status: DC | PRN
  Administered 2023-07-31: 50 mg via INTRAVENOUS

## 2023-07-31 MED ORDER — IBUPROFEN 800 MG PO TABS: 800.0000 mg | ORAL_TABLET | Freq: Three times a day (TID) | ORAL | 0 refills | Status: DC | PRN

## 2023-07-31 MED ORDER — BUPIVACAINE-EPINEPHRINE (PF) 0.25% -1:200000 IJ SOLN
INTRAMUSCULAR | Status: AC
Start: 2023-07-31 — End: ?
  Filled 2023-07-31: qty 30

## 2023-07-31 MED ORDER — PROPOFOL 10 MG/ML IV BOLUS
INTRAVENOUS | Status: DC | PRN
  Administered 2023-07-31: 30 mg via INTRAVENOUS
  Administered 2023-07-31: 170 mg via INTRAVENOUS

## 2023-07-31 MED ORDER — CHLORHEXIDINE GLUCONATE 0.12 % MT SOLN
OROMUCOSAL | Status: AC
  Filled 2023-07-31: qty 15

## 2023-07-31 MED ORDER — MIDAZOLAM HCL 2 MG/2ML IJ SOLN
INTRAMUSCULAR | Status: DC | PRN
  Administered 2023-07-31: 2 mg via INTRAVENOUS

## 2023-07-31 MED ORDER — ACETAMINOPHEN 10 MG/ML IV SOLN
INTRAVENOUS | Status: AC
  Filled 2023-07-31: qty 100

## 2023-07-31 MED ORDER — CHLORHEXIDINE GLUCONATE 0.12 % MT SOLN
15.0000 mL | Freq: Once | OROMUCOSAL | Status: AC
  Administered 2023-07-31: 15 mL via OROMUCOSAL

## 2023-07-31 MED ORDER — FENTANYL CITRATE (PF) 100 MCG/2ML IJ SOLN
INTRAMUSCULAR | Status: AC
  Filled 2023-07-31: qty 2

## 2023-07-31 SURGICAL SUPPLY — 34 items
BLADE CLIPPER SURG (BLADE) ×1 IMPLANT
CUTTER FLEX LINEAR 45M (STAPLE) ×1 IMPLANT
DERMABOND ADVANCED .7 DNX12 (GAUZE/BANDAGES/DRESSINGS) ×1 IMPLANT
ELECTRODE REM PT RTRN 9FT ADLT (ELECTROSURGICAL) ×1 IMPLANT
GLOVE ORTHO TXT STRL SZ7.5 (GLOVE) ×1 IMPLANT
GOWN STRL REUS W/ TWL LRG LVL3 (GOWN DISPOSABLE) ×1 IMPLANT
GOWN STRL REUS W/ TWL XL LVL3 (GOWN DISPOSABLE) ×1 IMPLANT
GRASPER SUT TROCAR 14GX15 (MISCELLANEOUS) IMPLANT
IRRIGATION STRYKERFLOW (MISCELLANEOUS) IMPLANT
KIT TURNOVER KIT A (KITS) ×1 IMPLANT
MANIFOLD NEPTUNE II (INSTRUMENTS) ×1 IMPLANT
NDL HYPO 22X1.5 SAFETY MO (MISCELLANEOUS) ×1 IMPLANT
NDL INSUFFLATION 14GA 120MM (NEEDLE) IMPLANT
NEEDLE HYPO 22X1.5 SAFETY MO (MISCELLANEOUS) ×1 IMPLANT
NEEDLE INSUFFLATION 14GA 120MM (NEEDLE) ×1 IMPLANT
NS IRRIG 500ML POUR BTL (IV SOLUTION) ×1 IMPLANT
PACK LAP CHOLECYSTECTOMY (MISCELLANEOUS) ×1 IMPLANT
RELOAD 45 VASCULAR/THIN (ENDOMECHANICALS) ×1 IMPLANT
RELOAD STAPLE 45 2.5 WHT GRN (ENDOMECHANICALS) ×1 IMPLANT
SET TUBE SMOKE EVAC HIGH FLOW (TUBING) ×1 IMPLANT
SHEARS HARMONIC 36 ACE (MISCELLANEOUS) ×1 IMPLANT
SLEEVE Z-THREAD 5X100MM (TROCAR) ×1 IMPLANT
SOL .9 NS 3000ML IRR UROMATIC (IV SOLUTION) IMPLANT
SPIKE FLUID TRANSFER (MISCELLANEOUS) ×1 IMPLANT
SUT VICRYL 0 UR6 27IN ABS (SUTURE) IMPLANT
SUTURE MNCRL 4-0 27XMF (SUTURE) ×1 IMPLANT
SYSTEM BAG RETRIEVAL 10MM (BASKET) ×1 IMPLANT
SYSTEM WECK SHIELD CLOSURE (TROCAR) IMPLANT
TRAP FLUID SMOKE EVACUATOR (MISCELLANEOUS) ×1 IMPLANT
TRAY FOLEY MTR SLVR 16FR STAT (SET/KITS/TRAYS/PACK) ×1 IMPLANT
TROCAR ADV FIXATION 12X100MM (TROCAR) ×1 IMPLANT
TROCAR Z-THREAD FIOS 12X100MM (TROCAR) IMPLANT
TROCAR Z-THREAD FIOS 5X100MM (TROCAR) ×1 IMPLANT
WATER STERILE IRR 500ML POUR (IV SOLUTION) ×1 IMPLANT

## 2023-07-31 NOTE — Plan of Care (Signed)
  Problem: Education: Goal: Knowledge of General Education information will improve Description: Including pain rating scale, medication(s)/side effects and non-pharmacologic comfort measures 07/31/2023 1325 by Robinson Chough, LPN Outcome: Adequate for Discharge 07/31/2023 1241 by Robinson Chough, LPN Outcome: Progressing   Problem: Health Behavior/Discharge Planning: Goal: Ability to manage health-related needs will improve 07/31/2023 1325 by Robinson Chough, LPN Outcome: Adequate for Discharge 07/31/2023 1241 by Robinson Chough, LPN Outcome: Progressing   Problem: Clinical Measurements: Goal: Ability to maintain clinical measurements within normal limits will improve Outcome: Adequate for Discharge Goal: Will remain free from infection Outcome: Adequate for Discharge Goal: Diagnostic test results will improve Outcome: Adequate for Discharge Goal: Respiratory complications will improve Outcome: Adequate for Discharge Goal: Cardiovascular complication will be avoided Outcome: Adequate for Discharge   Problem: Activity: Goal: Risk for activity intolerance will decrease 07/31/2023 1325 by Robinson Chough, LPN Outcome: Adequate for Discharge 07/31/2023 1241 by Robinson Chough, LPN Outcome: Progressing   Problem: Nutrition: Goal: Adequate nutrition will be maintained 07/31/2023 1325 by Robinson Chough, LPN Outcome: Adequate for Discharge 07/31/2023 1241 by Robinson Chough, LPN Outcome: Progressing   Problem: Coping: Goal: Level of anxiety will decrease Outcome: Adequate for Discharge   Problem: Elimination: Goal: Will not experience complications related to bowel motility 07/31/2023 1325 by Robinson Chough, LPN Outcome: Adequate for Discharge 07/31/2023 1241 by Robinson Chough, LPN Outcome: Progressing Goal: Will not experience complications related to urinary retention 07/31/2023 1325 by Robinson Chough, LPN Outcome: Adequate for Discharge 07/31/2023 1241 by Robinson Chough, LPN Outcome:  Progressing   Problem: Pain Managment: Goal: General experience of comfort will improve and/or be controlled 07/31/2023 1325 by Robinson Chough, LPN Outcome: Adequate for Discharge 07/31/2023 1241 by Robinson Chough, LPN Outcome: Progressing   Problem: Safety: Goal: Ability to remain free from injury will improve 07/31/2023 1325 by Robinson Chough, LPN Outcome: Adequate for Discharge 07/31/2023 1241 by Robinson Chough, LPN Outcome: Progressing   Problem: Skin Integrity: Goal: Risk for impaired skin integrity will decrease 07/31/2023 1325 by Robinson Chough, LPN Outcome: Adequate for Discharge 07/31/2023 1241 by Robinson Chough, LPN Outcome: Progressing

## 2023-07-31 NOTE — Anesthesia Procedure Notes (Signed)
 Procedure Name: Intubation Date/Time: 07/31/2023 9:29 AM  Performed by: Curvin Downing, CRNAPre-anesthesia Checklist: Patient identified, Emergency Drugs available, Suction available and Patient being monitored Patient Re-evaluated:Patient Re-evaluated prior to induction Oxygen Delivery Method: Circle system utilized Preoxygenation: Pre-oxygenation with 100% oxygen Induction Type: IV induction Ventilation: Mask ventilation without difficulty Laryngoscope Size: McGrath and 4 Grade View: Grade I Tube type: Oral Number of attempts: 1 Airway Equipment and Method: Stylet and Oral airway Placement Confirmation: ETT inserted through vocal cords under direct vision, positive ETCO2 and breath sounds checked- equal and bilateral Secured at: 22 cm Tube secured with: Tape Dental Injury: Teeth and Oropharynx as per pre-operative assessment

## 2023-07-31 NOTE — Anesthesia Preprocedure Evaluation (Addendum)
 Anesthesia Evaluation  Patient identified by MRN, date of birth, ID band Patient awake    Reviewed: Allergy & Precautions, H&P , NPO status , Patient's Chart, lab work & pertinent test results  Airway Mallampati: II  TM Distance: >3 FB Neck ROM: full    Dental no notable dental hx.    Pulmonary former smoker   Pulmonary exam normal        Cardiovascular negative cardio ROS Normal cardiovascular exam     Neuro/Psych negative neurological ROS  negative psych ROS   GI/Hepatic negative GI ROS, Neg liver ROS,,,  Endo/Other  negative endocrine ROS    Renal/GU      Musculoskeletal   Abdominal  (+) + obese  Peds  Hematology negative hematology ROS (+)   Anesthesia Other Findings acute appendicitis  Past Medical History: No date: Allergy No date: Anxiety No date: Asthma     Comment:  Hx as child 01/2019: COVID-19 virus infection No date: GERD (gastroesophageal reflux disease) No date: Heart murmur     Comment:  Hx as child No date: HLD (hyperlipidemia)  Past Surgical History: 08/03/2022: ACHILLES TENDON SURGERY; Left     Comment:  Procedure: ACHILLES TENDON REPAIR;  Surgeon: Elner Hahn, MD;  Location: [facility] ORS;  Service: Orthopedics;                Laterality: Left; 04/04/2002: ANKLE SURGERY     Comment:  left 08/2021: COLONOSCOPY     Comment:  WNL, rpt 10 yrs (Dr Vernard Goldberg at [facility] in               [place]) 06/03/2007: ESOPHAGOGASTRODUODENOSCOPY     Comment:  HIATAL HERNIA AND GERD 04/04/1993: FINGER SURGERY     Comment:  left 2nd finger repair  BMI    Body Mass Index: 31.98 kg/m      Reproductive/Obstetrics negative OB ROS                             Anesthesia Physical Anesthesia Plan  ASA: 2  Anesthesia Plan: General ETT   Post-op Pain Management: Toradol  IV (intra-op)* and Ofirmev  IV (intra-op)*   Induction: Intravenous  PONV Risk  Score and Plan: 2 and Ondansetron , Dexamethasone  and Midazolam   Airway Management Planned: Oral ETT  Additional Equipment:   Intra-op Plan:   Post-operative Plan: Extubation in OR  Informed Consent: I have reviewed the patients History and Physical, chart, labs and discussed the procedure including the risks, benefits and alternatives for the proposed anesthesia with the patient or authorized representative who has indicated his/her understanding and acceptance.     Dental Advisory Given  Plan Discussed with: CRNA and Surgeon  Anesthesia Plan Comments:        Anesthesia Quick Evaluation

## 2023-07-31 NOTE — Anesthesia Postprocedure Evaluation (Signed)
 Anesthesia Post Note  Patient: Shaun Phillips  Procedure(s) Performed: APPENDECTOMY, LAPAROSCOPIC  Patient location during evaluation: PACU Anesthesia Type: General Level of consciousness: awake and alert Pain management: pain level controlled Vital Signs Assessment: post-procedure vital signs reviewed and stable Respiratory status: spontaneous breathing, nonlabored ventilation, respiratory function stable and patient connected to nasal cannula oxygen Cardiovascular status: blood pressure returned to baseline and stable Postop Assessment: no apparent nausea or vomiting Anesthetic complications: no   No notable events documented.   Last Vitals:  Vitals:   07/31/23 1100 07/31/23 1118  BP: 130/82 132/84  Pulse: 73 74  Resp: 14 18  Temp: (!) 36.2 C 36.9 C  SpO2: 98% 98%    Last Pain:  Vitals:   07/31/23 1118  TempSrc: Oral  PainSc: 0-No pain                 Portia Brittle Cheyeanne Roadcap

## 2023-07-31 NOTE — Plan of Care (Signed)

## 2023-07-31 NOTE — Plan of Care (Signed)
  Problem: Education: Goal: Knowledge of General Education information will improve Description: Including pain rating scale, medication(s)/side effects and non-pharmacologic comfort measures Outcome: Progressing   Problem: Health Behavior/Discharge Planning: Goal: Ability to manage health-related needs will improve Outcome: Progressing   Problem: Activity: Goal: Risk for activity intolerance will decrease Outcome: Progressing   Problem: Nutrition: Goal: Adequate nutrition will be maintained Outcome: Progressing   Problem: Elimination: Goal: Will not experience complications related to bowel motility Outcome: Progressing Goal: Will not experience complications related to urinary retention Outcome: Progressing   Problem: Pain Managment: Goal: General experience of comfort will improve and/or be controlled Outcome: Progressing   Problem: Safety: Goal: Ability to remain free from injury will improve Outcome: Progressing   Problem: Skin Integrity: Goal: Risk for impaired skin integrity will decrease Outcome: Progressing

## 2023-07-31 NOTE — Transfer of Care (Signed)
 Immediate Anesthesia Transfer of Care Note  Patient: Shaun Phillips  Procedure(s) Performed: APPENDECTOMY, LAPAROSCOPIC  Patient Location: PACU  Anesthesia Type:General  Level of Consciousness: drowsy  Airway & Oxygen Therapy: Patient Spontanous Breathing and Patient connected to face mask oxygen  Post-op Assessment: Report given to RN  Post vital signs: stable  Last Vitals:  Vitals Value Taken Time  BP 128/79 07/31/23 1033  Temp 36.7 C 07/31/23 1032  Pulse 85 07/31/23 1034  Resp 14 07/31/23 1034  SpO2 98 % 07/31/23 1034  Vitals shown include unfiled device data.  Last Pain:  Vitals:   07/31/23 1032  TempSrc:   PainSc: 0-No pain         Complications: No notable events documented.

## 2023-07-31 NOTE — Op Note (Signed)
 Laparascopic appendectomy   Shaun Phillips Date of operation:  07/31/2023  Indications: The patient presented with a history of  abdominal pain. Workup has revealed findings consistent with acute appendicitis.  Pre-operative Diagnosis: Acute appendicitis without mention of peritonitis  Post-operative Diagnosis: Same  Surgeon: Flynn Hylan, M.D., FACS  Anesthesia: General with endotracheal tube  Findings: edematous inflammed appendix  Estimated Blood Loss: 5 mL         Specimens: appendix         Complications:  None  Procedure Details  The patient was seen again in the preop area. The options of surgery versus observation were reviewed with the patient and/or family. The risks of bleeding, infection, recurrence of symptoms, negative laparoscopy, potential for an open procedure, bowel injury, abscess or infection, were all reviewed as well. The patient was taken to Operating Room, identified as Shaun Phillips and the procedure verified as laparoscopic appendectomy. A Time Out was held and the above information confirmed. The patient was placed in the supine position and general anesthesia was induced.  Antibiotic prophylaxis was administered pre-op and VT E prophylaxis was in place.   The abdomen was prepped and draped in a sterile fashion. After local infiltration of quarter percent Marcaine  with epinephrine, stab incision was made left upper quadrant.  Just below the costal margin approximately midclavicular line the Veress needle is passed with sensation of the layers to penetrate the abdominal wall and into the peritoneum.  Saline drop test is confirmed peritoneal placement.  Insufflation is initiated with carbon dioxide to pressures of 15 mmHg. Local infiltration with 0.25% Marcaine  with epi is administered to all incisions.   An para-umbilical incision was made.  A 5 mm optical trocar was passed into the peritoneal cavity under direct visualization.  Pneumoperitoneum obtained. One 12 mm  port in the LLQ, and another 5 mm port were placed under direct visualization.  The appendix was identified.  The appendix was carefully mobilized and dissected. The mesoappendix was divided with Harmonic scalpel. The base of the appendix was dissected out and divided with a 45 mm white load Endo GIA.The appendix was placed in a Endo Catch bag and removed via the 12 mm LLQ port. The right lower quadrant and pelvis was then irrigated with  normal saline which was aspirated. Inspection  failed to identify any additional bleeding and there were no signs of bowel injury. Again the right lower quadrant was inspected there was no sign of bleeding or bowel injury. The LLQ fascia was closed with 0 Vicryl  using the Weck Epic suture passer, pneumoperitoneum was released, all ports were removed, and the skin incisions were approximated with subcuticular 4-0 Monocryl. Dermabond was applied.  The patient tolerated the procedure well, there were no complications. The sponge lap and needle count were correct at the end of the procedure.  The patient was taken to the recovery room in stable condition to be discharged to home, probably in the afternoon, when appropriate.   Flynn Hylan, M.D., [facility] 07/31/2023 - 10:26 AM

## 2023-07-31 NOTE — Interval H&P Note (Signed)
 History and Physical Interval Note:  07/31/2023 8:59 AM  Shaun Phillips  has presented today for surgery, with the diagnosis of acute appendicitis.  The various methods of treatment have been discussed with the patient and family. After consideration of risks, benefits and other options for treatment, the patient has consented to  Procedure(s): APPENDECTOMY, LAPAROSCOPIC (N/A) as a surgical intervention.  The patient's history has been reviewed, patient examined, no change in status, stable for surgery.  I have reviewed the patient's chart and labs.  Questions were answered to the patient's satisfaction.     Flynn Hylan

## 2023-08-01 ENCOUNTER — Encounter: Payer: Self-pay | Admitting: Surgery

## 2023-08-01 LAB — SURGICAL PATHOLOGY

## 2023-08-02 NOTE — Discharge Summary (Signed)
 Physician Discharge Summary  Patient ID: Shaun Phillips MRN: [id number] DOB/AGE: [birth year] 48 y.o.  Admit date: 07/30/2023 Discharge date: 07/31/2023  Admission Diagnoses:  Discharge Diagnoses:  Principal Problem:   Acute appendicitis   Discharged Condition: good  Hospital Course: Admitted for surgery, observation admit.  Excellent postoperative course patient discharged postop.  Consults: None  Significant Diagnostic Studies: radiology: CT scan: See report.  Treatments: surgery: Laparoscopic appendectomy.  Discharge Exam: Blood pressure 132/84, pulse 74, temperature 98.4 F (36.9 C), temperature source Oral, resp. rate 18, height 5\' 10"  (1.778 m), weight 101.1 kg, SpO2 98%. Lungs clear to auscultation, heart regular rate and rhythm, vitals within normal limits.  Abdomen soft benign nontender with incisions clean dry and intact.  Disposition: Discharge disposition: 01-Home or Self Care       Discharge Instructions     Call MD for:  persistant nausea and vomiting   Complete by: As directed    Call MD for:  redness, tenderness, or signs of infection (pain, swelling, redness, odor or green/yellow discharge around incision site)   Complete by: As directed    Call MD for:  severe uncontrolled pain   Complete by: As directed    Diet - low sodium heart healthy   Complete by: As directed    Discharge instructions   Complete by: As directed    May resume aspirin or other anticoagulants after 48 hours.   Discharge wound care:   Complete by: As directed    Your incision was closed with Dermabond.  It is best to keep it clean and dry, it will tolerate a brief shower, but do not soak it or apply any creams or lotions to the incisions.  The Dermabond should gradually flake off over time.  Keep it open to air so you can evaluate your incisions.  Dermabond assists the underlying sutures to keep your incision closed and protected from infection.  Should you develop some drainage from  your incision, some drops of drainage would be okay but if it persists continue to put keep a dry dressing over it.   Driving Restrictions   Complete by: As directed    No driving until cleared after follow-up appointment.  Is not advised to drive while taking narcotic pain medications or in significant pain.   Increase activity slowly   Complete by: As directed    Lifting restrictions   Complete by: As directed    Strongly advised against any form of lifting greater than 15 pounds over the next 4 to 6 weeks.  This involves pushing/pulling movements as well.  After 4 weeks when may gradually engage in more activities remaining aware of any new pain/tenderness elicited, and avoiding those for the full duration of 6 weeks.  Walking is encouraged.  Climbing stairs with caution.      Allergies as of 07/31/2023       Reactions   Ciprofloxacin Other (See Comments)   Chills, rigor.  No hives, shortness of breath or tongue/lip swelling        Medication List     TAKE these medications    ibuprofen 800 MG tablet Commonly known as: ADVIL Take 1 tablet (800 mg total) by mouth every 8 (eight) hours as needed.   oxyCODONE -acetaminophen  5-325 MG tablet Commonly known as: PERCOCET/ROXICET Take 1-2 tablets by mouth every 4 (four) hours as needed for moderate pain (pain score 4-6).               Discharge Care Instructions  (  From admission, onward)           Start     Ordered   07/31/23 0000  Discharge wound care:       Comments: Your incision was closed with Dermabond.  It is best to keep it clean and dry, it will tolerate a brief shower, but do not soak it or apply any creams or lotions to the incisions.  The Dermabond should gradually flake off over time.  Keep it open to air so you can evaluate your incisions.  Dermabond assists the underlying sutures to keep your incision closed and protected from infection.  Should you develop some drainage from your incision, some drops of  drainage would be okay but if it persists continue to put keep a dry dressing over it.   07/31/23 1038            Follow-up Information     Flynn Hylan, MD. Schedule an appointment as soon as possible for a visit on 08/10/2023.   Specialty: General Surgery Contact information: [address] [place] [address] [phone]                 Signed: Flynn Hylan, M.D., [facility] 08/02/2023, 2:58 PM

## 2023-08-10 ENCOUNTER — Ambulatory Visit: Admitting: Surgery

## 2023-08-10 ENCOUNTER — Encounter: Payer: Self-pay | Admitting: Surgery

## 2023-08-10 VITALS — BP 112/72 | HR 73 | Ht 70.5 in | Wt 219.0 lb

## 2023-08-10 DIAGNOSIS — Z09 Encounter for follow-up examination after completed treatment for conditions other than malignant neoplasm: Secondary | ICD-10-CM

## 2023-08-10 DIAGNOSIS — K3580 Unspecified acute appendicitis: Secondary | ICD-10-CM

## 2023-08-10 DIAGNOSIS — Z9049 Acquired absence of other specified parts of digestive tract: Secondary | ICD-10-CM | POA: Insufficient documentation

## 2023-08-10 NOTE — Progress Notes (Signed)
 [facility] POST-OP OFFICE VISIT  08/10/2023  HPI: Abdiaziz Kretzer is a 48 y.o. male had surgery on  July 31, 2023 , now s/p lap appy.   Doing very well, reports resolution of likely unrelated GI issues.   Path reviewed.  Relieved.   Vital signs: BP 112/72   Pulse 73   Ht 5' 10.5" (1.791 m)   Wt 219 lb (99.3 kg)   SpO2 98%   BMI 30.98 kg/m    Physical Exam: Constitutional: appears well   Abdomen: soft benign.  Skin: incisions c/d/I.   Assessment/Plan: This is a 48 y.o. male s/p lap appy.    Patient Active Problem List   Diagnosis Date Noted   Acute appendicitis 07/30/2023   ETD (Eustachian tube dysfunction), left 02/14/2023   Herpes labialis 02/27/2017   Near syncope 12/29/2014   Dyslipidemia 04/21/2014   Health maintenance examination 04/25/2012   Anxiety 11/02/2007   Allergic rhinitis 11/02/2007   GERD 11/02/2007    - f/u as needed.  Reviewed resumption of activity.    Flynn Hylan M.D., FACS 08/10/2023, 11:06 AM

## 2023-08-10 NOTE — Patient Instructions (Signed)

## 2024-04-11 ENCOUNTER — Ambulatory Visit (INDEPENDENT_AMBULATORY_CARE_PROVIDER_SITE_OTHER): Admitting: Family Medicine

## 2024-04-11 ENCOUNTER — Ambulatory Visit: Payer: Self-pay

## 2024-04-11 ENCOUNTER — Encounter: Payer: Self-pay | Admitting: Family Medicine

## 2024-04-11 VITALS — BP 140/84 | HR 107 | Temp 99.9°F | Ht 70.0 in | Wt 221.3 lb

## 2024-04-11 DIAGNOSIS — R030 Elevated blood-pressure reading, without diagnosis of hypertension: Secondary | ICD-10-CM

## 2024-04-11 DIAGNOSIS — J029 Acute pharyngitis, unspecified: Secondary | ICD-10-CM | POA: Diagnosis not present

## 2024-04-11 DIAGNOSIS — R509 Fever, unspecified: Secondary | ICD-10-CM | POA: Diagnosis not present

## 2024-04-11 DIAGNOSIS — R52 Pain, unspecified: Secondary | ICD-10-CM

## 2024-04-11 DIAGNOSIS — J020 Streptococcal pharyngitis: Secondary | ICD-10-CM

## 2024-04-11 LAB — POCT RAPID STREP A (OFFICE): Rapid Strep A Screen: NEGATIVE

## 2024-04-11 LAB — POC COVID19 BINAXNOW: SARS Coronavirus 2 Ag: NEGATIVE

## 2024-04-11 MED ORDER — OSELTAMIVIR PHOSPHATE 75 MG PO CAPS: 75.0000 mg | ORAL_CAPSULE | Freq: Two times a day (BID) | ORAL | 0 refills | Status: AC

## 2024-04-11 MED ORDER — AMOXICILLIN-POT CLAVULANATE 875-125 MG PO TABS: 1.0000 | ORAL_TABLET | Freq: Two times a day (BID) | ORAL | 0 refills | Status: AC

## 2024-04-11 NOTE — Telephone Encounter (Signed)
 Noted. Appreciate them seeing pt.

## 2024-04-11 NOTE — Progress Notes (Signed)
 "  Acute Office Visit  Patient ID: Shaun Phillips, male    DOB: 1976/02/12, 49 y.o.   MRN: [id number]  PCP: Rilla Baller, MD  Chief Complaint  Patient presents with   Sore Throat    Left sided tonsil pain with white patch, body aches, fever, chills, swollen lymph nodes L side x one day  Was recently sick on 12/19 with uri, was still getting over this. Had fever and chills yesterday and symptoms progressed     Subjective:     HPI  Discussed the use of AI scribe software for clinical note transcription with the patient, who gave verbal consent to proceed.  History of Present Illness Shaun Phillips is a 49 year old male who presents with left-sided tonsil pain and systemic symptoms.  He has experienced left-sided tonsil pain for one day, with a white patch on the tonsil and more swelling on the left side compared to the right. The pain is significant, especially when swallowing, but he does not feel like something is stuck in his throat.  He reports systemic symptoms including body aches, fevers, and chills that began yesterday, along with enlarged lymph nodes on the left side of his neck. He experienced similar symptoms in December, which he attributed to an upper respiratory infection affecting his entire family. During that episode, he was bedridden for four days and took about five more days to fully recover.  He has not taken any medication for the current episode but previously used Tylenol , cold and flu medication, and Mucinex D during the December illness. He switched to a nasal decongestant when symptoms were more in his head than his chest. He has not experienced any coughing or green phlegm, only white phlegm, and denies any current diarrhea.  His past medical history includes hyperlipidemia, GERD, anxiety, and elevated blood pressure, which was recorded at 140/84. He has a history of frequent strep and sinus problems, particularly when he was younger. He used to smoke but quit  several years ago.  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 He has not received a flu shot this year but typically does not have adverse reactions to it.   ROS     Objective:    BP (!) 140/84 (BP Location: Left Arm, Patient Position: Sitting, Cuff Size: Normal)   Pulse (!) 107   Temp 99.9 F (37.7 C) (Oral)   Ht 5' 10 (1.778 m)   Wt 221 lb 4.8 oz (100.4 kg)   SpO2 98%   BMI 31.75 kg/m  BP Readings from Last 3 Encounters:  04/11/24 (!) 140/84  08/10/23 112/72  07/31/23 132/84   Wt Readings from Last 3 Encounters:  04/11/24 221 lb 4.8 oz (100.4 kg)  08/10/23 219 lb (99.3 kg)  07/31/23 222 lb 14.2 oz (101.1 kg)      Physical Exam  Physical Exam VITALS: BP- 140/84 HEENT: White exudate on left tonsil. Enlarged tender lymph nodes on left anterior cervical chain. Right tympanic membrane effusion, no erythema or bulging. Normal left tympanic membrane. No maxillary sinus tenderness. CHEST: Lungs clear to auscultation bilaterally, no wheezes, rhonchi, or crackles. CARDIOVASCULAR: Regular rate and rhythm, S1 and S2 normal without murmurs.    No results found for any visits on 04/11/24.     Assessment & Plan:   Problem List Items Addressed This Visit   None Visit Diagnoses       Pharyngitis  due to Streptococcus species    -  Primary   Relevant Medications   oseltamivir  (TAMIFLU ) 75 MG capsule   amoxicillin -clavulanate (AUGMENTIN ) 875-125 MG tablet     Sore throat       Relevant Orders   POCT rapid strep A   POC COVID-19 BinaxNow     Elevated blood pressure reading         Fever and chills       Relevant Medications   oseltamivir  (TAMIFLU ) 75 MG capsule     Body aches       Relevant Medications   oseltamivir  (TAMIFLU ) 75 MG capsule       Assessment and Plan Assessment & Plan Acute pharyngitis Left-sided tonsil pain, white patch on tonsil, body aches, fever, chills, and  enlarged lymph nodes on the left side. Symptoms began one day ago. Differential includes strep pharyngitis and viral pharyngitis. Empirical treatment for strep pharyngitis is warranted due to exudate, pain with swallowing, and significantly enlarged lymph nodes. - Prescribed Augmentin  875-125mg  mg twice daily for 7 days - Advised to stay hydrated while on abx therapy   Suspected influenza infection Symptoms include fever, chills, and body aches. COVID test pending, but initial results are negative. Empirical treatment for influenza is considered due to the possibility of reinfection and the presence of flu-like symptoms. - Prescribed Tamiflu  75 mg twice daily for 5 days  General health maintenance Discussion about flu vaccination. Advised to wait until February for flu shot due to current illness and ongoing flu season. - Advised to get flu shot in mid February, 1 month after recovery from acute illness     Meds ordered this encounter  Medications   oseltamivir  (TAMIFLU ) 75 MG capsule    Sig: Take 1 capsule (75 mg total) by mouth 2 (two) times daily for 5 days.    Dispense:  10 capsule    Refill:  0   amoxicillin -clavulanate (AUGMENTIN ) 875-125 MG tablet    Sig: Take 1 tablet by mouth 2 (two) times daily.    Dispense:  20 tablet    Refill:  0    Return if symptoms worsen or fail to improve.  Rockie Agent, MD [facility]   "

## 2024-04-11 NOTE — Telephone Encounter (Signed)
 FYI Only or Action Required?: FYI only for provider: appointment scheduled on 04/11/24.  Patient was last seen in primary care on 05/05/2023 by Rilla Baller, MD.  Called Nurse Triage reporting Sore Throat, Generalized Body Aches, and Chills.  Symptoms began yesterday.  Interventions attempted: OTC medications: Tylenol , Motrin .  Symptoms are: gradually worsening.  Triage Disposition: See Physician Within 24 Hours  Patient/caregiver understands and will follow disposition?: Yes           Copied from CRM #[id number]. Topic: Clinical - Red Word Triage >> Apr 11, 2024  8:03 AM Harlene ORN wrote: Red Word that prompted transfer to Nurse Triage: swelling in his throat; fever; body aches and chills Reason for Disposition  [1] Pus on tonsils (back of throat) AND [2] fever AND [3] swollen neck lymph nodes (glands)  Answer Assessment - Initial Assessment Questions No none exposure to COVID or flu. He states he was recently sick around Christmas with similar symptoms but then was also having nasal congestion. Treating with Tylenol  and motrin .  1. ONSET: When did the throat start hurting? (Hours or days ago)      Teacher, [employer] Language.  2. SEVERITY: How bad is the sore throat? (Scale 1-10; mild, moderate or severe)     3-4/10.  3. STREP EXPOSURE: Has there been any exposure to strep within the past week? If Yes, ask: What type of contact occurred?      No.  4.  VIRAL SYMPTOMS: Are there any symptoms of a cold, such as a runny nose, cough, hoarse voice or red eyes?      No nasal congestion, runny nose, cough,red/watery/itchy eyes.  5. FEVER: Do you have a fever? If Yes, ask: What is your temperature, how was it measured, and when did it start?     low grade fever not measuring,   6. PUS ON THE TONSILS: Is there pus on the tonsils in the back of your throat?     Yes, some white patches on left side but states it doesn't look like when he has had strep in the past.  7. OTHER  SYMPTOMS: Do you have any other symptoms? (e.g., difficulty breathing, headache, rash)     Body aches, chills, left side swollen tonsil and lymph nodes along lower left side of neck and collar bone feel swollen, hoarse voice. No difficulty breathing or difficulty swallowing, rash.  Protocols used: Sore Throat-A-AH

## 2024-04-11 NOTE — Patient Instructions (Signed)
 To keep you healthy, please keep in mind the following health maintenance items that you are due for:   Health Maintenance Due  Topic Date Due   Hepatitis B Vaccines 19-59 Average Risk (1 of 3 - 19+ 3-dose series) Never done   DTaP/Tdap/Td (2 - Tdap) 11/01/2017   Influenza Vaccine  11/03/2023   COVID-19 Vaccine (3 - 2025-26 season) 12/04/2023     Best Wishes,   Dr. Lang

## 2024-04-17 ENCOUNTER — Telehealth: Payer: Self-pay

## 2024-04-17 DIAGNOSIS — Z833 Family history of diabetes mellitus: Secondary | ICD-10-CM

## 2024-04-17 DIAGNOSIS — E785 Hyperlipidemia, unspecified: Secondary | ICD-10-CM

## 2024-04-17 NOTE — Telephone Encounter (Signed)
 Copied from CRM [id number]. Topic: Clinical - Request for Lab/Test Order >> Apr 16, 2024  3:50 PM Pinkey ORN wrote: Reason for CRM: Requested Labs >> Apr 16, 2024  3:51 PM Pinkey ORN wrote: Patient is requesting to have his lab orders submitted for him to come in prior to his scheduled physical and have the lab work done.

## 2024-04-17 NOTE — Telephone Encounter (Signed)
 CPE- 06/07/24

## 2024-04-17 NOTE — Telephone Encounter (Addendum)
 Please schedule fasting labs 1 wk prior to appt and I will order closer to the date.

## 2024-05-06 DIAGNOSIS — E66811 Obesity, class 1: Secondary | ICD-10-CM | POA: Insufficient documentation

## 2024-05-06 NOTE — Telephone Encounter (Signed)
 Labs ordered.

## 2024-05-24 ENCOUNTER — Other Ambulatory Visit

## 2024-06-07 ENCOUNTER — Encounter: Admitting: Family Medicine
